# Patient Record
Sex: Male | Born: 1960
Health system: Southern US, Community
[De-identification: ages and names within clinical notes are randomized; demographics above are authoritative.]

## PROBLEM LIST (undated history)

## (undated) DIAGNOSIS — E785 Hyperlipidemia, unspecified: Secondary | ICD-10-CM

## (undated) DIAGNOSIS — I10 Essential (primary) hypertension: Secondary | ICD-10-CM

## (undated) DIAGNOSIS — K635 Polyp of colon: Secondary | ICD-10-CM

## (undated) DIAGNOSIS — I2510 Atherosclerotic heart disease of native coronary artery without angina pectoris: Secondary | ICD-10-CM

## (undated) HISTORY — DX: Essential (primary) hypertension: I10

## (undated) HISTORY — DX: Polyp of colon: K63.5

## (undated) HISTORY — DX: Hyperlipidemia, unspecified: E78.5

## (undated) HISTORY — DX: Atherosclerotic heart disease of native coronary artery without angina pectoris: I25.10

---

## 2006-05-31 ENCOUNTER — Inpatient Hospital Stay (HOSPITAL_COMMUNITY): Admission: EM | Admit: 2006-05-31 | Discharge: 2006-06-02 | Payer: Self-pay | Admitting: Pediatrics

## 2007-10-17 ENCOUNTER — Encounter: Admission: RE | Admit: 2007-10-17 | Discharge: 2007-10-17 | Payer: Self-pay | Admitting: Specialist

## 2011-01-21 NOTE — H&P (Signed)
Andrew Clark NO.:  [id number]   MEDICAL RECORD NO.:  [id number]          PATIENT TYPE:  INP   LOCATION:  2036                         FACILITY:  [facility]   PHYSICIAN:  Francisca December, M.D.  DATE OF BIRTH:  November 14, 1960   DATE OF ADMISSION:  05/31/2006  DATE OF DISCHARGE:                                HISTORY & PHYSICAL   REASON FOR ADMISSION:  Chest pain.   HISTORY OF PRESENT ILLNESS:  [name] is a pleasant 50 year old man  without prior cardiac history who, while in staff meeting this morning  developed the onset of difficulty breathing.  He was able move air in and  out, but there was a pressure in his chest.  This pressure intensified and  became more heavy and aching in nature.  It did not radiate.  It was mid-  substernal.  It was associated with some mild diaphoresis.  It persisted  over 15-20 minutes and as he got up to walk to his office, he became  somewhat lightheaded.  He finally was taken to the plant nursing office  where blood pressure was noted to be elevated.  He was driven to his primary  care physician's office, where again, his blood pressure was elevated and he  continued to have chest discomfort.  An EKG there was unremarkable.  He was  transported by EMS to [facility] emergency room and received his third  nitroglycerin en route.  Shortly after arriving here, he received an  additional nitroglycerin as well as 4 mg of morphine.  At the time of my  evaluation at 11:30 a.m.Marland Kitchen he is without significant chest discomfort.  He  does complain of being quite anxious and appears so.   He has had mild chest discomfort intermittently and spontaneous for the past  month or two.  Has awoken from sleep at night.  It is not clearly  exertional.  There is mild dyspnea associated with it.  It usually resolves  within 10 minutes or so on it's own.   PAST MEDICAL HISTORY:  1. Hypertension.  2. Hyperlipidemia.  3. High work stress.  4. Erectile  dysfunction.   PAST SURGICAL HISTORY:  Unremarkable.   ALLERGIES:  SULFA, unknown reaction.   CURRENT MEDICATIONS:  1. Simvastatin 40 mg p.o. q.d.  2. atenolol 50 mg.  3. Viagra 1-2 as needed p.r.n.   SOCIAL HISTORY:  Rare tobacco usage in the form of cigarettes.  He drinks  alcohol 3-4 times a week, usually 2-3 ounces at a time.  Has been a heavy  drinker in the past.  Has been a heavier smoker in the past.  Decreased in  the past year or two.  He is accompanied in the emergency room by his  coworker and his ex-wife.  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   FAMILY HISTORY:  His father developed congestive heart failure and had  murmurs late in his 40's and early 88s.  In his 66s, he underwent a valve  operation.  He is not certain about any  problem with coronary artery  disease.  His mother has frequent complaints of muscle and joint disorder,  but he is uncertain of any specific diagnosis.   REVIEW OF SYSTEMS:  Otherwise negative.   PHYSICAL EXAMINATION:  VITAL SIGNS:  Blood pressure 145/85, has been as high  as 185/105.  The heart rate is 67, respiratory rate 20, temperature 98.2, O2  saturation on room air 98%.  GENERAL:  This is a anxious, middle-aged Caucasian male in no distress, well-  kept.  HEENT:  Unremarkable.  Head is atraumatic, normocephalic.  Pupils are equal,  round and reactive to light and accommodation.  Extraocular movements are  intact.  Sclerae are anicteric.  Oral mucosa is pink and moist.  Teeth and  gums in good repair.  Tongue is not coated.  NECK:  Supple without thyromegaly or masses.  The, upstrokes are normal.  There is no bruit is noted ending distension.  CHEST:  Clear with adequate excursion, normal vesicular breath sounds are  heard throughout.  The precordium is quiet.  normal S1-S2 is heard.  no S3-  S4, murmur, click or rub noted.  ABDOMEN:  Flat, soft, nontender.  No hepatosplenomegaly or midline pulsatile   mass.  Bowel sounds present all quadrants.  EXTERNAL GENITALIA:  Normal male phallus, descended testicles.  No lesions.  RECTAL:  Not performed.  EXTREMITIES:  Show full range of motion.  No edema.  Intact distal pulses.  NEUROLOGICAL: Cranial nerves II-XII intact.  Motor and sensory grossly  intact.  Gait not tested.  SKIN:  Warm, dry and clear.   CLINICAL DATA:  Laboratory is pending.  Chest x-ray is pending.  EKG normal  sinus rhythm, normal EKG.   IMPRESSION:  1. Relatively acute onset of chest pain with associated dyspnea and      nausea.  Certainly entirely compatible with unstable angina pectoris.      Normal EKG does not rule this out.  2. Multiple risk factors coronary heart disease including hypertension,      hyperlipidemia and male sex in his 57s.  3. Hypertension exacerbated daily.  4. Dyslipidemia.  5. Questionable degree of ethanol abuse.  6. High work stress.   PLAN:  1. The patient is admitted for telemetry monitoring.  Serial CK-MB and      troponin enzymes to be drawn.  Repeat ECG.  2. IV nitroglycerin, beta blocker, aspirin, all been administered.  3. Morphine sulfate and p.r.n. lorazepam.  4. Proton pump inhibitor.  5. Subcutaneous Lovenox 100 mg subcu q.12 h.  6. Further diagnostic testing to be based on results of his laboratory      work and repeat ECG.  Likely will recall require coronary angiography      prior to discharge.   This was briefly discussed with the patient.  Goals, risks, alternatives  were reviewed agrees to proceed, if required.,      Francisca December, M.D.  Electronically Signed     JHE/MEDQ  D:  05/31/2006  T:  06/01/2006  Job:  [id number]   cc:   Jethro Bastos, M.D.

## 2011-01-21 NOTE — Cardiovascular Report (Signed)
NAMEASTOR, GENTLE NO.:  [id number]   MEDICAL RECORD NO.:  [id number]          PATIENT TYPE:  INP   LOCATION:  2036                         FACILITY:  [facility]   PHYSICIAN:  Francisca December, M.D.  DATE OF BIRTH:  [birth year]   DATE OF PROCEDURE:  06/01/2006  DATE OF DISCHARGE:                              CARDIAC CATHETERIZATION   REFERRING PHYSICIAN:  Jethro Bastos, M.D.   PROCEDURES PERFORMED:  1. Left heart catheterization.  2. Left ventriculogram.  3. Coronary angiography.  4. PCI drug-eluting stent implantation mid-LAD.   INDICATIONS:  [name] is a 50 year old man who was admitted  yesterday with a prolonged episode of chest pain and near syncope.  He has  ruled out for myocardial infarction.  Because of multiple risk factors and  the typical nature of his chest discomfort, he is brought to catheterization  laboratory at this time to identify the extent of disease and provide for  further therapeutic options.   PROCEDURE NOTE:  The patient was brought to the cardiac catheterization  laboratory in a fasting state.  The right groin was prepped and draped in  the usual sterile fashion.  Local anesthesia was obtained with the  infiltration of 1% lidocaine.  A 6-French catheter sheath was inserted  percutaneously into the right femoral artery, utilizing an anterior approach  over guiding J-wire.  Left heart catheterization and coronary angiography  then proceeded in a standard fashion using 6-French #4 left and right  Judkins catheters and a 110-cm pigtail catheter.  A 45 degree RAO cine left  ventriculogram was performed, utilizing a power injector, 45 mL were  injected at 13 cc/sec.  The patient did receive intracoronary nitroglycerin  during the coronary angiography which was performed in multiple LAO and RAO  projections.   I then proceeded with coronary intervention of the mid-anterior descending  artery.  The patient had received 100 mg of  Lovenox therapeutic dose within  the last 8 hours.  He was administered a double bolus and constant infusion  of Integrilin.  A 6-French number 3.5 CLS guiding catheter was advanced to  the ascending aorta, where the left coronary os was engaged and the and a  0.014 inch Luge intracoronary guidewire was passed across the lesion in the  mid LAD without difficulty.  The lesion was primarily stented using a  2.75/12-mm Scimed TAXUS intracoronary drug-eluting stent.  This was  carefully positioned and deployed at peak pressure of 12 atmospheres for  approximately 45 seconds.  A second balloon dilatation for approximately 10  seconds was required to get the balloon to release from the stent.  The  stent balloon was removed and a 2.75/8-mm Scimed Quantum Maverick  intracoronary balloon was carefully positioned within the stented segment  and inflated on several occasions to as high a pressure of 16 atmospheres.  This balloon was deflated and removed and following confirmation of adequate  patency in orthogonal views, both with and without the guidewire in place,  the guiding catheter was removed.  A right femoral arteriogram in the 45  degree  RAO angulation, utilizing the catheter sheath by hand injection  documented adequate anatomy for placement of the percutaneous closure  device, AngioSeal.  This was subsequently deployed with good hemostasis and  an intact distal pulse.   HEMODYNAMIC RESULTS:  1. Systemic arterial pressure was 122/81 with a mean of 100-mmHg.  2. There is no systolic gradient across the aortic valve.  3. The left ventricular end-diastolic pressure was 16-mmHg pre      ventriculogram.   ANGIOGRAPHY:  1. The left ventriculogram demonstrated normal chamber size and normal      global systolic function without regional wall motion abnormality.  2. There was no coronary calcification seen.  3. There was no mitral regurgitation.  4. The aortic valve is trileaflet and opens  normally during systole.  5. A visual estimate of ejection fraction is 65%.   1. There is a right dominant coronary system present.  2. The main left coronary artery was normal.  3. The left anterior descending artery and its branches are highly      diseased; the vessel had a large septal perforator which subdivides in      the mid and proximal septum.  At the origin of the first septal      perforator, there is a 10-20% eccentric narrowing.  The ongoing vessel      then gives rise to a large diagonal branch which subdivides on the      anterior lateral wall of the heart and the ongoing anterior descending      artery is relatively small.  It reaches but does not traverse the apex      and is highly diseased; there is a focal 90% stenosis approximately 50-      mm from the origin.  The diagonal branch and the segment between the      origin of the diagonal branch and the lesion is diffusely diseased.  4. The left circumflex coronary and its branches are minimally diseased;      there is a 20% narrowing in the origin of a large second marginal      branch.  The first marginal branch is quite small.  The ongoing      circumflex gives rise only to a small posterolateral branch.  5. The right coronary artery and its branches show luminal irregularities.      There is a 20% narrowing in the proximal to mid segments.  Distally,      the vessel divides into a small to moderate size posterolateral segment      which gives rise to one small to moderate size left ventricular branch.      The posterior descending artery itself is relatively large and reaches      but does not traverse the apex.  There are no obstructions in the      distal right coronary or its distal branches.   Following balloon dilatation and stent implantation in the mid anterior  descending artery, there is a 10% residual stenosis.   FINAL IMPRESSION:  1. Atherosclerotic cardiovascular disease, single vessel. 2. Status  post successful percutaneous coronary intervention and slash      drug-eluting stent implantation mid-anterior descending artery.  3. Intact left ventricular size and global systolic function.  4. Mild angina in the form of mild chest discomfort and left arm aching      was produced during balloon inflation and stent deployment.      Francisca December, M.D.  Electronically Signed  JHE/MEDQ  D:  06/01/2006  T:  06/03/2006  Job:  [id number]

## 2011-05-01 ENCOUNTER — Encounter: Payer: Self-pay | Admitting: Emergency Medicine

## 2011-05-01 ENCOUNTER — Inpatient Hospital Stay (INDEPENDENT_AMBULATORY_CARE_PROVIDER_SITE_OTHER)
Admission: RE | Admit: 2011-05-01 | Discharge: 2011-05-01 | Disposition: A | Discharge status: Home / Self Care | Payer: 59 | Source: Ambulatory Visit | Attending: Emergency Medicine | Admitting: Emergency Medicine

## 2011-05-01 DIAGNOSIS — J019 Acute sinusitis, unspecified: Secondary | ICD-10-CM

## 2011-05-01 DIAGNOSIS — I10 Essential (primary) hypertension: Secondary | ICD-10-CM | POA: Insufficient documentation

## 2011-08-08 NOTE — Progress Notes (Signed)
Summary: sinus infec?/TM   Vital Signs:  Patient Profile:   50 Years Old Male CC:      ? SINUS INFECTION Height:     73 inches Weight:      219.75 pounds O2 Sat:      97 % O2 treatment:    Room Air Temp:     98.3 degrees F oral Pulse rate:   77 / minute Resp:     18 per minute BP sitting:   133 / 87  (left arm) Cuff size:   regular  Vitals Entered By: Linton Flemings RN (May 01, 2011 12:39 PM)                  Updated Prior Medication List: * PLAVIX 50 MG TABS (CLOPIDOGREL BISULFATE)  DAILY ATENOLOL 50 MG TABS (ATENOLOL) DAILY  CRESTOR 40 MG TABS (ROSUVASTATIN CALCIUM) DAILY ASPIRIN 325 MG TABS (ASPIRIN) DAILY  Current Allergies: ! SULFAHistory of Present Illness Chief Complaint: ? SINUS INFECTION History of Present Illness: 50 Years Old Male complains of onset of cold symptoms for a few days.  Abishai has been using Motrin which is helping a little bit.   Flying to [place] in a few days. No sore throat No cough No pleuritic pain No wheezing + nasal irritation and presure (R) No post-nasal drainage + sinus pain/pressure No chest congestion No itchy/red eyes No earache No hemoptysis No SOB No chills/sweats No fever No nausea No vomiting No abdominal pain No diarrhea No skin rashes No fatigue No myalgias No headache   REVIEW OF SYSTEMS Constitutional Symptoms      Denies fever, chills, night sweats, weight loss, weight gain, and fatigue.  Eyes       Denies change in vision, eye pain, eye discharge, glasses, contact lenses, and eye surgery. Ear/Nose/Throat/Mouth       Complains of frequent runny nose and sinus problems.      Denies hearing loss/aids, change in hearing, ear pain, ear discharge, dizziness, frequent nose bleeds, sore throat, hoarseness, and tooth pain or bleeding.  Respiratory       Denies dry cough, productive cough, wheezing, shortness of breath, asthma, bronchitis, and emphysema/COPD.  Cardiovascular       Denies murmurs, chest pain, and  tires easily with exhertion.    Gastrointestinal       Denies stomach pain, nausea/vomiting, diarrhea, constipation, blood in bowel movements, and indigestion. Genitourniary       Denies painful urination, kidney stones, and loss of urinary control. Neurological       Denies paralysis, seizures, and fainting/blackouts. Musculoskeletal       Denies muscle pain, joint pain, joint stiffness, decreased range of motion, redness, swelling, muscle weakness, and gout.  Skin       Denies bruising, unusual mles/lumps or sores, and hair/skin or nail changes.  Psych       Denies mood changes, temper/anger issues, anxiety/stress, speech problems, depression, and sleep problems.  Past History:  Past Medical History: heart dz Hypertension  Past Surgical History: stents  Social History: smoke- no alcohol-no drug use- no Physical Exam General appearance: well developed, well nourished, no acute distress Head: R nasal/sphenoid tenderness Ears: normal, no lesions or deformities Nasal: mucosa pink, nonedematous, no septal deviation, turbinates normal Oral/Pharynx: tongue normal, posterior pharynx without erythema or exudate Chest/Lungs: no rales, wheezes, or rhonchi bilateral, breath sounds equal without effort Heart: regular rate and  rhythm, no murmur MSE: oriented to time, place, and person Assessment New Problems: HYPERTENSION (ICD-401.9)  ACUTE SINUSITIS, UNSPECIFIED (ICD-461.9)   Plan New Medications/Changes: AUGMENTIN 875-125 MG TABS (AMOXICILLIN-POT CLAVULANATE) 1 by mouth two times a day for 10 days  #20 x 0, 05/01/2011, Hoyt Koch MD  New Orders: New Patient Level III [phone] Planning Comments:   1)  Take the prescribed antibiotic as instructed. 2)  Use nasal saline solution (over the counter) at least 3 times a day. 3)  Use over the counter decongestants like Zyrtec-D every 12 hours as needed to help with congestion.  Especially before flying.  Caution with HTN. 4)   Can take tylenol every 6 hours or motrin every 8 hours for pain or fever. 5)  Follow up with your primary doctor  if no improvement in 5-7 days, sooner if increasing pain, fever, or new symptoms.    The patient and/or caregiver has been counseled thoroughly with regard to medications prescribed including dosage, schedule, interactions, rationale for use, and possible side effects and they verbalize understanding.  Diagnoses and expected course of recovery discussed and will return if not improved as expected or if the condition worsens. Patient and/or caregiver verbalized understanding.  Prescriptions: AUGMENTIN 875-125 MG TABS (AMOXICILLIN-POT CLAVULANATE) 1 by mouth two times a day for 10 days  #20 x 0   Entered and Authorized by:   Hoyt Koch MD   Signed by:   Hoyt Koch MD on 05/01/2011   Method used:   Print then Give to Patient   RxID:   [id number]   Orders Added: 1)  New Patient Level III [43329]

## 2013-10-30 ENCOUNTER — Other Ambulatory Visit: Payer: Self-pay | Admitting: Interventional Cardiology

## 2014-02-28 ENCOUNTER — Encounter: Payer: Self-pay | Admitting: Interventional Cardiology

## 2014-04-03 ENCOUNTER — Ambulatory Visit: Payer: 59 | Admitting: Interventional Cardiology

## 2014-05-01 ENCOUNTER — Other Ambulatory Visit: Payer: Self-pay | Admitting: Interventional Cardiology

## 2014-05-16 ENCOUNTER — Encounter: Payer: Self-pay | Admitting: Cardiology

## 2014-05-16 ENCOUNTER — Ambulatory Visit (INDEPENDENT_AMBULATORY_CARE_PROVIDER_SITE_OTHER): Payer: BC Managed Care – PPO | Admitting: Interventional Cardiology

## 2014-05-16 ENCOUNTER — Encounter: Payer: Self-pay | Admitting: Interventional Cardiology

## 2014-05-16 VITALS — BP 118/88 | HR 74 | Ht 73.0 in | Wt 234.0 lb

## 2014-05-16 DIAGNOSIS — E785 Hyperlipidemia, unspecified: Secondary | ICD-10-CM

## 2014-05-16 DIAGNOSIS — I2510 Atherosclerotic heart disease of native coronary artery without angina pectoris: Secondary | ICD-10-CM

## 2014-05-16 DIAGNOSIS — I10 Essential (primary) hypertension: Secondary | ICD-10-CM

## 2014-05-16 NOTE — Progress Notes (Signed)
Patient ID: Andrew Clark, male   DOB: [birth year], 52 y.o.   MRN: [id number]    [address] [place], [place]  [address] Phone: [phone] Fax:  [phone]  Date:  05/16/2014   ID:  Andrew Clark, DOB 1960-10-18, MRN [id number]  PCP:  Darrow Bussing, MD      History of Present Illness: Andrew Clark is a 53 y.o. male who had an LAD stent in 2007. He has been taking DAPT since that time. No problems walking up stairs. He is remodelling a house and working hard without any cardiac sx. CAD/ASCVD:  Prior to stent, he had typical angina while at work. He was SOB, left arm numbness, chest pressure, and began getting cold and clammy. He had to stop walking before getting to his office. He was hypertensive. He stopped smoking. He exercises on the treadmill. He lifts weights also.  Decreased in the last month. Exercise was limited due to a right biceps tear, but this is better. No cardiac sx prior to the biceps tear a few months ago. His weight has gone up since then. Denies : Chest pain.  Diaphoresis.  Dizziness.  Dyspnea on exertion.  Fatigue.  Leg edema.  Nitroglycerin.  Orthopnea.  Palpitations.  Paroxysmal nocturnal dyspnea.  Shortness of breath.  Syncope.     Wt Readings from Last 3 Encounters:  05/16/14 234 lb (106.142 kg)  05/01/11 219 lb 12 oz (99.678 kg)     Past Medical History  Diagnosis Date  . Coronary atherosclerosis of native coronary artery   . Essential hypertension, benign   . Other and unspecified hyperlipidemia   . ASCVD (arteriosclerotic cardiovascular disease)     (single vessel sten) Dr. Amil Amen 2007  . Hyperplastic colon polyp     repeat 10 years Dr. Bosie Clos    Current Outpatient Prescriptions  Medication Sig Dispense Refill  . aspirin 325 MG tablet Take 325 mg by mouth daily.      Marland Kitchen atenolol (TENORMIN) 50 MG tablet Take 50 mg by mouth daily.       . betamethasone dipropionate (DIPROLENE) 0.05 % cream Apply topically daily.       .  clopidogrel (PLAVIX) 75 MG tablet TAKE 1 TABLET BY MOUTH ONCE A DAY  30 tablet  0  . CRESTOR 40 MG tablet Take 40 mg by mouth daily.       Marland Kitchen loteprednol (LOTEMAX) 0.5 % ophthalmic suspension       . VIAGRA 100 MG tablet Take 50 mg by mouth as needed for erectile dysfunction.        No current facility-administered medications for this visit.    Allergies:    Allergies  Allergen Reactions  . Sulfonamide Derivatives     Social History:  The patient  reports that he has quit smoking. He does not have any smokeless tobacco history on file.   Family History:  The patient's family history includes CAD in his father; Heart disease in his father.   ROS:  Please see the history of present illness.  No nausea, vomiting.  No fevers, chills.  No focal weakness.  No dysuria.   All other systems reviewed and negative.   PHYSICAL EXAM: VS:  BP 118/88  Pulse 74  Ht  (1.854 m)  Wt 234 lb (106.142 kg)  BMI 30.88 kg/m2 Well nourished, well developed, in no acute distress HEENT: normal Neck: no JVD, no carotid bruits Cardiac:  normal S1, S2; RRR;  Lungs:  clear to  auscultation bilaterally, no wheezing, rhonchi or rales Abd: soft, nontender, no hepatomegaly Ext: no edema Skin: warm and dry Neuro:   no focal abnormalities noted  EKG:  NSR, normal  ASSESSMENT AND PLAN:  Coronary atherosclerosis of native coronary artery  Continue Aspirin Tablet Delayed Release, 325 MG, 1 tablet, Orally, Once a day IMAGING: EKG    Corson,Danielle 04/04/2013 11:44:23 AM > Margerite Impastato,JAY 04/04/2013 12:01:07 PM > Normal   Notes: No angina.   No plan for stress test at this time since he is feeling well.  Negative stress in 2008.   2. Essential hypertension, benign  Continue Atenolol Tablet, 50 Milligram, TAKE 1 TABLET BY MOUTH ONCE A DAY Notes: Controlled usually.  Continue attempts at weight loss.       3. Hyperlipidemia  Continue Crestor Tablet, 40 Milligram, TAKE 1 TABLET BY MOUTH ONCE A DAY Notes:  LDL < 70 at last check. LDL 39 in 1/15.   Preventive Medicine  Adult topics discussed:  Diet: healthy diet, low calorie, low fat.  Exercise: 5 days a week, at least 30 minutes of aerobic exercise.      Signed, Andrew Mare, MD, [facility] 05/16/2014 4:48 PM

## 2014-05-16 NOTE — Patient Instructions (Signed)
Your physician recommends that you continue on your current medications as directed. Please refer to the Current Medication list given to you today.  Your physician wants you to follow-up in: 1 YEAR WITH DR. VARANASI. You will receive a reminder letter in the mail two months in advance. If you don't receive a letter, please call our office to schedule the follow-up appointment.  

## 2014-06-03 ENCOUNTER — Other Ambulatory Visit: Payer: Self-pay | Admitting: Interventional Cardiology

## 2014-08-02 ENCOUNTER — Encounter: Payer: Self-pay | Admitting: *Deleted

## 2014-08-18 ENCOUNTER — Telehealth: Payer: Self-pay | Admitting: Interventional Cardiology

## 2014-08-18 NOTE — Telephone Encounter (Signed)
New message      Please call Dr Genelle Balevaneyat [phone] to clear pt to have a tooth extracted.  He needs to stop his plavix and aspirin prior to extraction.  Please call dentist and give clearance

## 2014-08-19 NOTE — Telephone Encounter (Signed)
Follow Up    Pt is following up regarding holding Plavix and Asprin x 5 days prior to dental work. Please call.

## 2014-08-19 NOTE — Telephone Encounter (Signed)
I spoke with the patient. He is aware that I will review Dr. Eldridge DaceVaranasi in the morning and be in touch with his dentist office.

## 2014-08-20 ENCOUNTER — Encounter: Payer: Self-pay | Admitting: *Deleted

## 2014-08-20 NOTE — Telephone Encounter (Signed)
I called and spoke with Lela at Dr. Leonie Manevaney's office. Will fax a note to [phone] stating it is ok for him to hold ASA and plavix for 5 days prior to tooth extraction. The patient is also aware.

## 2014-08-20 NOTE — Telephone Encounter (Signed)
Ok to hold aspirin and plavix for 5 days prior to dental work.

## 2015-05-26 ENCOUNTER — Other Ambulatory Visit: Payer: Self-pay | Admitting: Interventional Cardiology

## 2015-06-22 ENCOUNTER — Other Ambulatory Visit: Payer: Self-pay | Admitting: Interventional Cardiology

## 2015-06-22 MED ORDER — CLOPIDOGREL BISULFATE 75 MG PO TABS: 75.0000 mg | ORAL_TABLET | Freq: Every day | ORAL | Status: DC

## 2015-07-16 ENCOUNTER — Other Ambulatory Visit: Payer: Self-pay | Admitting: Interventional Cardiology

## 2015-09-02 ENCOUNTER — Other Ambulatory Visit: Payer: Self-pay | Admitting: Interventional Cardiology

## 2015-09-02 NOTE — Telephone Encounter (Signed)
Pt has been given several warning in his refill notes that he needs an appt, starting in sept, he is overdue by 3 months and no appt. Thanks

## 2015-09-03 NOTE — Telephone Encounter (Signed)
Spoke with pt and scheduled appt with Dr. Eldridge DaceVaranasi for 10/29/15. Sent in prescription to requested pharmacy and advised pt that he would need to keep appt to continue to get refills. Pt verbalized understanding and was in agreement with this plan.

## 2015-10-28 NOTE — Progress Notes (Signed)
Patient ID: Andrew Clark, male   DOB: 07-21-1961, 55 y.o.   MRN: [id number]     Cardiology Office Note   Date:  10/29/2015   ID:  [name], DOB 1960/10/21, MRN [id number]  PCP:  Darrow Bussing, MD    No chief complaint on file. CAD   Wt Readings from Last 3 Encounters:  10/29/15 229 lb (103.874 kg)  05/16/14 234 lb (106.142 kg)  05/01/11 219 lb 12 oz (99.678 kg)       History of Present Illness: Andrew Clark is a 55 y.o. male  who had an LAD stent in 2007. He has been taking DAPT since that time. No problems walking up stairs. He is running or exercise several times a week.   CAD/ASCVD:  Prior to stent, he had typical angina while at work. He was SOB, left arm numbness, chest pressure, and began getting cold and clammy. He had to stop walking before getting to his office.   He has trouble sleeping.  He had been using Advil PM.  Exercise helps him sleep. He stopped smoking years ago.  He exercises on the treadmill. He lifts light weights also. He feels well when he exercises. No symptoms like what he had before his stent.  Denies : Chest pain.  Diaphoresis.  Dizziness.  Dyspnea on exertion.  Fatigue.  Leg edema.  Nitroglycerin.  Orthopnea.  Palpitations.  Paroxysmal nocturnal dyspnea.  Shortness of breath.  Syncope.     Past Medical History  Diagnosis Date  . Coronary atherosclerosis of native coronary artery   . Essential hypertension, benign   . Other and unspecified hyperlipidemia   . ASCVD (arteriosclerotic cardiovascular disease)     (single vessel sten) Dr. Amil Amen 2007  . Hyperplastic colon polyp     repeat 10 years Dr. Bosie Clos    History reviewed. No pertinent past surgical history.   Current Outpatient Prescriptions  Medication Sig Dispense Refill  . aspirin 325 MG tablet Take 325 mg by mouth daily.    Marland Kitchen atenolol (TENORMIN) 50 MG tablet Take 50 mg by mouth daily.     . clopidogrel (PLAVIX) 75 MG tablet Take 1 tablet (75 mg total) by mouth  once. 90 tablet 0  . CRESTOR 40 MG tablet Take 40 mg by mouth daily.     Marland Kitchen VIAGRA 100 MG tablet Take 50 mg by mouth as needed for erectile dysfunction.      No current facility-administered medications for this visit.    Allergies:   Sulfonamide derivatives    Social History:  The patient  reports that he has quit smoking. He does not have any smokeless tobacco history on file.   Family History:  The patient's family history includes CAD in his father; Heart disease in his father; Hypertension in his father. There is no history of Heart attack or Stroke.    ROS:  Please see the history of present illness.   Otherwise, review of systems are positive for easy bruising.   All other systems are reviewed and negative.    PHYSICAL EXAM: VS:  BP 129/90 mmHg  Pulse 72  Ht  (1.854 m)  Wt 229 lb (103.874 kg)  BMI 30.22 kg/m2 , BMI Body mass index is 30.22 kg/(m^2). GEN: Well nourished, well developed, in no acute distress HEENT: left eyelid droops Neck: no JVD, carotid bruits, or masses Cardiac: RRR; no murmurs, rubs, or gallops,no edema  Respiratory:  clear to auscultation bilaterally, normal work of breathing GI: soft, nontender, nondistended, +  BS MS: no deformity or atrophy Skin: warm and dry, no rash Neuro:  Strength and sensation are intact Psych: euthymic mood, full affect   EKG:   The ekg ordered today demonstrates normal   Recent Labs: No results found for requested labs within last 365 days.   Lipid Panel No results found for: CHOL, TRIG, HDL, CHOLHDL, VLDL, LDLCALC, LDLDIRECT   Other studies Reviewed: Additional studies/ records that were reviewed today with results demonstrating: 2008 normal stress test.  2007 cath report reviewed. LAD stent placed, taxus. Mild disease in the circumflex and right.   ASSESSMENT AND PLAN:  1. CAD : No angina. Plan for stress test at this time since his stent is 54 years old. Negative stress in 2008.  Stop aspirin to reduce  risk of bleeding. 2. HTN: Controlled. Continue current medicines. 3. Hyperlipidemia: Followed by PMD. Continue current lipid-lowering therapy. 4. Bruising: Should improve with stopping aspirin. Continue clopidogrel for secondary prevention. 5. Insomnia: OK to use melatonin.    Current medicines are reviewed at length with the patient today.  The patient concerns regarding his medicines were addressed.  The following changes have been made:  Stop aspirin  Labs/ tests ordered today include:  Treadmill test  No orders of the defined types were placed in this encounter.    Recommend 150 minutes/week of aerobic exercise Low fat, low carb, high fiber diet recommended  Disposition:   FU in 1 year   Delorise Jackson., MD  10/29/2015 9:11 AM    [facility] [address] [place], [place], [place]  [address] Phone: [phone]; Fax: [phone]

## 2015-10-29 ENCOUNTER — Encounter: Payer: Self-pay | Admitting: Interventional Cardiology

## 2015-10-29 ENCOUNTER — Ambulatory Visit (INDEPENDENT_AMBULATORY_CARE_PROVIDER_SITE_OTHER): Payer: BLUE CROSS/BLUE SHIELD | Admitting: Interventional Cardiology

## 2015-10-29 VITALS — BP 129/90 | HR 72 | Ht 73.0 in | Wt 229.0 lb

## 2015-10-29 DIAGNOSIS — T148XXA Other injury of unspecified body region, initial encounter: Secondary | ICD-10-CM | POA: Insufficient documentation

## 2015-10-29 DIAGNOSIS — T148 Other injury of unspecified body region: Secondary | ICD-10-CM

## 2015-10-29 DIAGNOSIS — I2510 Atherosclerotic heart disease of native coronary artery without angina pectoris: Secondary | ICD-10-CM

## 2015-10-29 DIAGNOSIS — E785 Hyperlipidemia, unspecified: Secondary | ICD-10-CM

## 2015-10-29 DIAGNOSIS — I10 Essential (primary) hypertension: Secondary | ICD-10-CM | POA: Diagnosis not present

## 2015-10-29 DIAGNOSIS — G4700 Insomnia, unspecified: Secondary | ICD-10-CM | POA: Diagnosis not present

## 2015-10-29 NOTE — Addendum Note (Signed)
Addended by: Demetrios Loll on: 10/29/2015 10:34 AM   Modules accepted: Orders, Medications

## 2015-10-29 NOTE — Patient Instructions (Addendum)
**Note De-Identified Deni Lefever Obfuscation** Medication Instructions:  Stop taking Aspirin-all other medications remain the same.  Labwork: None  Testing/Procedures: Your physician has requested that you have an exercise tolerance test. For further information please visit [email or web address] Please also follow instruction sheet, as given.  Follow-Up: Your physician wants you to follow-up in: 1 year. You will receive a reminder letter in the mail two months in advance. If you don't receive a letter, please call our office to schedule the follow-up appointment.     If you need a refill on your cardiac medications before your next appointment, please call your pharmacy.

## 2015-11-06 ENCOUNTER — Ambulatory Visit (INDEPENDENT_AMBULATORY_CARE_PROVIDER_SITE_OTHER): Payer: BLUE CROSS/BLUE SHIELD

## 2015-11-06 DIAGNOSIS — I2510 Atherosclerotic heart disease of native coronary artery without angina pectoris: Secondary | ICD-10-CM | POA: Diagnosis not present

## 2015-11-06 LAB — EXERCISE TOLERANCE TEST
CHL CUP STRESS STAGE 1 DBP: 89 mmHg
CHL CUP STRESS STAGE 1 GRADE: 0 %
CHL CUP STRESS STAGE 10 DBP: 90 mmHg
CHL CUP STRESS STAGE 10 HR: 97 {beats}/min
CHL CUP STRESS STAGE 10 SBP: 151 mmHg
CHL CUP STRESS STAGE 2 SPEED: 1 mph
CHL CUP STRESS STAGE 3 GRADE: 0 %
CHL CUP STRESS STAGE 3 HR: 97 {beats}/min
CHL CUP STRESS STAGE 3 SPEED: 1 mph
CHL CUP STRESS STAGE 4 DBP: 87 mmHg
CHL CUP STRESS STAGE 4 SBP: 143 mmHg
CHL CUP STRESS STAGE 4 SPEED: 1.7 mph
CHL CUP STRESS STAGE 5 HR: 120 {beats}/min
CHL CUP STRESS STAGE 6 DBP: 71 mmHg
CHL CUP STRESS STAGE 6 SPEED: 3.4 mph
CHL CUP STRESS STAGE 7 DBP: 79 mmHg
CHL CUP STRESS STAGE 7 SPEED: 4.2 mph
CHL CUP STRESS STAGE 8 HR: 164 {beats}/min
CHL CUP STRESS STAGE 9 GRADE: 0 %
CHL CUP STRESS STAGE 9 SBP: 213 mmHg
CHL CUP STRESS STAGE 9 SPEED: 1.5 mph
CHL RATE OF PERCEIVED EXERTION: 15
CSEPED: 13 min
CSEPEDS: 0 s
CSEPEW: 15.1 METS
CSEPHR: 98 %
CSEPPHR: 164 {beats}/min
MPHR: 167 {beats}/min
Percent of predicted max HR: 98 %
Rest HR: 78 {beats}/min
Stage 1 HR: 81 {beats}/min
Stage 1 SBP: 145 mmHg
Stage 1 Speed: 0 mph
Stage 10 Grade: 0 %
Stage 10 Speed: 0 mph
Stage 2 Grade: 0 %
Stage 2 HR: 96 {beats}/min
Stage 4 Grade: 10 %
Stage 4 HR: 108 {beats}/min
Stage 5 DBP: 75 mmHg
Stage 5 Grade: 12 %
Stage 5 SBP: 146 mmHg
Stage 5 Speed: 2.5 mph
Stage 6 Grade: 14 %
Stage 6 HR: 136 {beats}/min
Stage 6 SBP: 154 mmHg
Stage 7 Grade: 16 %
Stage 7 HR: 151 {beats}/min
Stage 7 SBP: 191 mmHg
Stage 8 Grade: 18 %
Stage 8 Speed: 4.9 mph
Stage 9 DBP: 78 mmHg
Stage 9 HR: 150 {beats}/min

## 2015-11-29 ENCOUNTER — Other Ambulatory Visit: Payer: Self-pay | Admitting: Interventional Cardiology

## 2016-04-01 DIAGNOSIS — H02413 Mechanical ptosis of bilateral eyelids: Secondary | ICD-10-CM | POA: Diagnosis not present

## 2016-04-01 DIAGNOSIS — H02831 Dermatochalasis of right upper eyelid: Secondary | ICD-10-CM | POA: Diagnosis not present

## 2016-04-01 DIAGNOSIS — H0279 Other degenerative disorders of eyelid and periocular area: Secondary | ICD-10-CM | POA: Diagnosis not present

## 2016-04-01 DIAGNOSIS — H02834 Dermatochalasis of left upper eyelid: Secondary | ICD-10-CM | POA: Diagnosis not present

## 2016-05-10 ENCOUNTER — Telehealth: Payer: Self-pay

## 2016-05-10 NOTE — Telephone Encounter (Signed)
No further cardiac testing needed before procedure.  Hold plavix 5 days prior to procedure.

## 2016-05-10 NOTE — Telephone Encounter (Signed)
Antiplatelet management and cardiac clearance both need to be completed by MD rather than PharmD, thanks.

## 2016-05-10 NOTE — Telephone Encounter (Signed)
The pt needs to have bilateral upper eyelid blepharoplasty, ptosis repair and brow ptosis repair with Dr Shawna Orleansenzo Zaldivar. They are requesting: 1. Cardiac clearance. 2. When should the pt stop taking Plavix prior to procedure and when should he resume?   Please advise.

## 2016-05-11 NOTE — Telephone Encounter (Signed)
This message has been manually faxed to Dr Shawna Orleansenzo Zaldivar at [phone]. I did receive confirmation that the fax was successful.

## 2016-06-10 DIAGNOSIS — H02834 Dermatochalasis of left upper eyelid: Secondary | ICD-10-CM | POA: Diagnosis not present

## 2016-06-10 DIAGNOSIS — H02831 Dermatochalasis of right upper eyelid: Secondary | ICD-10-CM | POA: Diagnosis not present

## 2016-06-10 DIAGNOSIS — H02413 Mechanical ptosis of bilateral eyelids: Secondary | ICD-10-CM | POA: Diagnosis not present

## 2016-06-10 DIAGNOSIS — H0279 Other degenerative disorders of eyelid and periocular area: Secondary | ICD-10-CM | POA: Diagnosis not present

## 2016-07-27 DIAGNOSIS — J029 Acute pharyngitis, unspecified: Secondary | ICD-10-CM | POA: Diagnosis not present

## 2016-07-27 DIAGNOSIS — J069 Acute upper respiratory infection, unspecified: Secondary | ICD-10-CM | POA: Diagnosis not present

## 2016-08-18 DIAGNOSIS — J069 Acute upper respiratory infection, unspecified: Secondary | ICD-10-CM | POA: Diagnosis not present

## 2016-10-04 DIAGNOSIS — H02412 Mechanical ptosis of left eyelid: Secondary | ICD-10-CM | POA: Diagnosis not present

## 2016-11-09 ENCOUNTER — Telehealth: Payer: Self-pay | Admitting: Interventional Cardiology

## 2016-11-09 NOTE — Telephone Encounter (Signed)
Patient is calling because he is scheduled for an procedure on Friday 11-18-16. Dr. Gean Maidensassandra White would like clarence from Dr.Varanasi for patient to suspend Plavix  for 5 days prior to operation on 11-18-16. Please call Dr. Cliffton AstersWhite at [phone]. Thanks.

## 2016-11-10 NOTE — Telephone Encounter (Signed)
Request for surgical clearance:  1. What type of surgery is being performed? Left upper lid ptosis repair   2. When is this surgery scheduled? 11/18/16   3. Are there any medications that need to be held prior to surgery and how long? Patient needs to hold Plavix for 5 days.    4. Name of physician performing surgery? Dr. Gean Maidensassandra White   5. What is your office phone and fax number? Phone- [phone], Fax-[phone]

## 2016-11-11 NOTE — Telephone Encounter (Signed)
Follow up    Andrew Clark is calling to follow up on surgical clearance.

## 2016-11-11 NOTE — Telephone Encounter (Signed)
F/u Call

## 2016-11-11 NOTE — Telephone Encounter (Signed)
OK to hold Plavix 5 days prior to surgery.  No further cardiac testing needed. 

## 2016-11-11 NOTE — Telephone Encounter (Signed)
**Note De-Identified Andrew Clark Obfuscation** I have faxed this message to Duwayne HeckDanielle at [phone]. I did receive confirmation that the fax was successful. I also called and verified that Danielle received my fax and she stated that she did.

## 2016-11-11 NOTE — Telephone Encounter (Addendum)
**Note De-Identified Tanise Russman Obfuscation** Per Duwayne Heckanielle they need clearance today as the pt needs to stop his Plaxix 5 days prior to procedure. Will forward to Dr Eldridge DaceVaranasi as high priority.

## 2016-11-18 DIAGNOSIS — H02412 Mechanical ptosis of left eyelid: Secondary | ICD-10-CM | POA: Diagnosis not present

## 2016-11-25 ENCOUNTER — Other Ambulatory Visit: Payer: Self-pay | Admitting: Interventional Cardiology

## 2016-12-05 ENCOUNTER — Encounter: Payer: Self-pay | Admitting: Interventional Cardiology

## 2016-12-20 ENCOUNTER — Encounter: Payer: Self-pay | Admitting: Interventional Cardiology

## 2016-12-20 ENCOUNTER — Ambulatory Visit (INDEPENDENT_AMBULATORY_CARE_PROVIDER_SITE_OTHER): Payer: BLUE CROSS/BLUE SHIELD | Admitting: Interventional Cardiology

## 2016-12-20 ENCOUNTER — Encounter (INDEPENDENT_AMBULATORY_CARE_PROVIDER_SITE_OTHER): Payer: Self-pay

## 2016-12-20 VITALS — BP 126/68 | HR 76 | Ht 73.0 in | Wt 235.0 lb

## 2016-12-20 DIAGNOSIS — T148XXA Other injury of unspecified body region, initial encounter: Secondary | ICD-10-CM | POA: Diagnosis not present

## 2016-12-20 DIAGNOSIS — I2510 Atherosclerotic heart disease of native coronary artery without angina pectoris: Secondary | ICD-10-CM

## 2016-12-20 DIAGNOSIS — G4700 Insomnia, unspecified: Secondary | ICD-10-CM | POA: Diagnosis not present

## 2016-12-20 DIAGNOSIS — E782 Mixed hyperlipidemia: Secondary | ICD-10-CM

## 2016-12-20 DIAGNOSIS — I10 Essential (primary) hypertension: Secondary | ICD-10-CM

## 2016-12-20 NOTE — Patient Instructions (Signed)

## 2016-12-20 NOTE — Progress Notes (Signed)
Patient ID: Andrew Clark, male   DOB: 11/13/1960, 56 y.o.   MRN: [id number]     Cardiology Office Note   Date:  12/20/2016   ID:  Andrew Clark, DOB [birth year], MRN [id number]  PCP:  Darrow Bussing, MD    No chief complaint on file. CAD   Wt Readings from Last 3 Encounters:  12/20/16 235 lb (106.6 kg)  10/29/15 229 lb (103.9 kg)  05/16/14 234 lb (106.1 kg)       History of Present Illness: Andrew Clark is a 56 y.o. male  who had an LAD stent in 2007. He has been taking DAPT since that time. No problems walking up stairs. He is running or exercise several times a week.   CAD/ASCVD:  Prior to stent, he had typical angina while at work. He was SOB, left arm numbness, chest pressure, and began getting cold and clammy. He had to stop walking before getting to his office.    Exercise helps him sleep. He stopped smoking years ago.  He exercises on the treadmill. He lifts light weights also. He feels well when he exercises. No symptoms like what he had before his stent.  He has difficulty losing weight.  Denies : Chest pain. Diaphoresis. Dizziness. Dyspnea on exertion. Fatigue. Leg edema.  Nitroglycerin. Orthopnea. Palpitations. Paroxysmal nocturnal dyspnea.  Shortness of breath.  Syncope.   BP at work is in the 135 range systolic.  He had successful eyelid surgery.  He came off plavix without issues.  Past Medical History:  Diagnosis Date  . ASCVD (arteriosclerotic cardiovascular disease)    (single vessel sten) Dr. Amil Amen 2007  . Coronary atherosclerosis of native coronary artery   . Essential hypertension, benign   . Hyperplastic colon polyp    repeat 10 years Dr. Bosie Clos  . Other and unspecified hyperlipidemia     No past surgical history on file.   Current Outpatient Prescriptions  Medication Sig Dispense Refill  . atenolol (TENORMIN) 50 MG tablet Take 50 mg by mouth daily.     . clopidogrel (PLAVIX) 75 MG tablet TAKE 1 TABLET BY MOUTH EVERY DAY 90 tablet 0  .  CRESTOR 40 MG tablet Take 40 mg by mouth daily.     Marland Kitchen VIAGRA 100 MG tablet Take 50 mg by mouth as needed for erectile dysfunction.      No current facility-administered medications for this visit.     Allergies:   Sulfonamide derivatives    Social History:  The patient  reports that he has quit smoking. He has never used smokeless tobacco. He reports that he does not use drugs.   Family History:  The patient's family history includes CAD in his father; Heart disease in his father; Hypertension in his father.    ROS:  Please see the history of present illness.   Otherwise, review of systems are positive for easy bruising.   All other systems are reviewed and negative.    PHYSICAL EXAM: VS:  BP 126/68   Pulse 76   Ht  (1.854 m)   Wt 235 lb (106.6 kg)   BMI 31.00 kg/m  , BMI Body mass index is 31 kg/m. GEN: Well nourished, well developed, in no acute distress  HEENT: left eyelid droops Neck: no JVD, carotid bruits, or masses Cardiac: RRR; no murmurs, rubs, or gallops,no edema  Respiratory:  clear to auscultation bilaterally, normal work of breathing GI: soft, nontender, nondistended, + BS MS: no deformity or atrophy  Skin: warm and dry, no  rash Neuro:  Strength and sensation are intact Psych: euthymic mood, full affect   EKG:   The ekg ordered today demonstrates normal sinus rhythm, no ST segment changes   Recent Labs: No results found for requested labs within last 8760 hours.   Lipid Panel No results found for: CHOL, TRIG, HDL, CHOLHDL, VLDL, LDLCALC, LDLDIRECT   Other studies Reviewed: Additional studies/ records that were reviewed today with results demonstrating: 2008 normal stress test.  2007 cath report reviewed. LAD stent placed, taxus. Mild disease in the circumflex and right.  2017 stress test normal.    ASSESSMENT AND PLAN:  1. CAD : No angina. Plan for stress test at this time since his stent is 55 years old. Negative stress in 2017.  Stopped  aspirin to reduce risk of bleeding.  COntinue clopidogrel. 2. HTN: Controlled. Continue current medicines.   3. Hyperlipidemia: Followed by PMD. Continue current lipid-lowering therapy. 4. Bruising: Better off of aspirin- but still present.  Continue clopidogrel for secondary prevention. 5. Insomnia: Uses melatonin.  Occasional use of Advil PM. Should avoid advil on a regular basis.   Current medicines are reviewed at length with the patient today.  The patient concerns regarding his medicines were addressed.  The following changes have been made:    Labs/ tests ordered today include:  Treadmill test  No orders of the defined types were placed in this encounter.   Recommend 150 minutes/week of aerobic exercise Low fat, low carb, high fiber diet recommended  Disposition:   FU in 1 year   Signed, Lance Muss, MD  12/20/2016 4:20 PM    [facility] [address] [place], [place], [place]  [address] Phone: [phone]; Fax: [phone]

## 2017-02-03 DIAGNOSIS — L03115 Cellulitis of right lower limb: Secondary | ICD-10-CM | POA: Diagnosis not present

## 2017-02-28 ENCOUNTER — Other Ambulatory Visit: Payer: Self-pay | Admitting: Interventional Cardiology

## 2017-04-03 DIAGNOSIS — D1800 Hemangioma unspecified site: Secondary | ICD-10-CM | POA: Diagnosis not present

## 2017-04-03 DIAGNOSIS — D225 Melanocytic nevi of trunk: Secondary | ICD-10-CM | POA: Diagnosis not present

## 2017-04-03 DIAGNOSIS — L821 Other seborrheic keratosis: Secondary | ICD-10-CM | POA: Diagnosis not present

## 2017-04-03 DIAGNOSIS — L814 Other melanin hyperpigmentation: Secondary | ICD-10-CM | POA: Diagnosis not present

## 2017-05-01 DIAGNOSIS — Z23 Encounter for immunization: Secondary | ICD-10-CM | POA: Diagnosis not present

## 2017-05-01 DIAGNOSIS — I2510 Atherosclerotic heart disease of native coronary artery without angina pectoris: Secondary | ICD-10-CM | POA: Diagnosis not present

## 2017-05-01 DIAGNOSIS — Z0000 Encounter for general adult medical examination without abnormal findings: Secondary | ICD-10-CM | POA: Diagnosis not present

## 2017-05-01 DIAGNOSIS — R7301 Impaired fasting glucose: Secondary | ICD-10-CM | POA: Diagnosis not present

## 2017-05-01 DIAGNOSIS — I10 Essential (primary) hypertension: Secondary | ICD-10-CM | POA: Diagnosis not present

## 2017-08-09 ENCOUNTER — Telehealth: Payer: Self-pay | Admitting: Interventional Cardiology

## 2017-08-09 NOTE — Telephone Encounter (Signed)
° °  [facility] Pre-operative Risk Assessment    Request for surgical clearance:  1. What type of surgery is being performed? Indirect sinus lift with immediate placement #3 area  2. When is this surgery scheduled? TBD  3. Are there any medications that need to be held prior to surgery and how long? Plavix, 2-3 days prior to surgery  4. Practice name and name of physician performing surgery? [facility], Dr. Helene Kelp Biggerstaff  5. What is your office phone and fax number? PH# [phone], FAX# [phone]  5. Anesthesia type (None, local, MAC, general) ? Intravenous sedation and local anesthesia with epinephrine   Derl Barrow 08/09/2017, 2:38 PM  _________________________________________________________________   (provider comments below)

## 2017-08-12 NOTE — Telephone Encounter (Signed)
   Primary Cardiologist: Lance MussJayadeep Varanasi, MD  Chart reviewed as part of pre-operative protocol coverage. Patient was contacted 08/12/2017 in reference to pre-operative risk assessment for pending surgery as outlined below.  Andrew Clark was last seen on 12/20/16 by Dr. Eldridge DaceVaranasi.  Since that day, Andrew Clark has done very well. RCRI calculated at low risk of cardiac complications (0.9%). Remains active with job, goes to the gym several times a week, able to play sports without angina.  Therefore, based on ACC/AHA guidelines, the patient would be at acceptable risk for the planned procedure without further cardiovascular testing.   Will forward to Dr. Eldridge DaceVaranasi for final input on holding of Plavix prior to procedure (pt on Plavix monotherapy). Surgery is requesting to hold only 2-3 days prior, but will ask Dr. Seth BakeV to provide input if he thinks it may be OK to hold longer if surgery deems necessary (I.e. 5 days). Dr. Eldridge DaceVaranasi please route reply to P CV DIV PREOP. Thanks.  Andrew Montanaayna N Saivion Goettel, PA-C 08/12/2017, 2:40 PM

## 2017-08-16 NOTE — Telephone Encounter (Signed)
   Chart reviewed as part of pre-operative protocol coverage. To summarize recommendations, the patient would be at acceptable risk for the planned procedure without further cardiovascular testing. Per Dr. Eldridge DaceVaranasi, OK to hold Plavix 5 days prior to surgery.  Will route this bundled recommendation to requesting provider via Epic fax function. Please call with questions.  Laurann Montanaayna N Jushua Waltman, PA-C 08/16/2017, 11:01 AM

## 2017-08-16 NOTE — Telephone Encounter (Signed)
OK to hold Plavix 5 days prior to surgery. 

## 2017-08-21 ENCOUNTER — Telehealth: Payer: Self-pay | Admitting: Interventional Cardiology

## 2017-08-21 NOTE — Telephone Encounter (Signed)
Called re: pts cardiac clearance.  They never received it. I have refaxed the letter to [phone].  They thanked me for the call.

## 2017-08-21 NOTE — Telephone Encounter (Signed)
Returned call to Dr. Aletta EdouardBiggerstaff's office.  I have refaxed the clearance for pt.

## 2017-08-21 NOTE — Telephone Encounter (Signed)
New message     Pre op clearance letter not received by  Dr. Inda Cokeeresa Biggerstaff (not on Epic) Please fax   office phone and fax number? PH# [phone], FAX# [phone]

## 2017-09-04 DIAGNOSIS — J069 Acute upper respiratory infection, unspecified: Secondary | ICD-10-CM | POA: Diagnosis not present

## 2017-09-04 DIAGNOSIS — J3489 Other specified disorders of nose and nasal sinuses: Secondary | ICD-10-CM | POA: Diagnosis not present

## 2017-09-04 DIAGNOSIS — R062 Wheezing: Secondary | ICD-10-CM | POA: Diagnosis not present

## 2017-09-04 DIAGNOSIS — R05 Cough: Secondary | ICD-10-CM | POA: Diagnosis not present

## 2017-10-16 DIAGNOSIS — I10 Essential (primary) hypertension: Secondary | ICD-10-CM | POA: Diagnosis not present

## 2017-10-16 DIAGNOSIS — R062 Wheezing: Secondary | ICD-10-CM | POA: Diagnosis not present

## 2017-10-30 DIAGNOSIS — Z125 Encounter for screening for malignant neoplasm of prostate: Secondary | ICD-10-CM | POA: Diagnosis not present

## 2017-11-29 DIAGNOSIS — R972 Elevated prostate specific antigen [PSA]: Secondary | ICD-10-CM | POA: Diagnosis not present

## 2017-11-29 DIAGNOSIS — N400 Enlarged prostate without lower urinary tract symptoms: Secondary | ICD-10-CM | POA: Diagnosis not present

## 2017-11-30 ENCOUNTER — Other Ambulatory Visit: Payer: Self-pay | Admitting: Interventional Cardiology

## 2017-12-29 DIAGNOSIS — J029 Acute pharyngitis, unspecified: Secondary | ICD-10-CM | POA: Diagnosis not present

## 2017-12-30 ENCOUNTER — Other Ambulatory Visit: Payer: Self-pay | Admitting: Interventional Cardiology

## 2018-02-13 ENCOUNTER — Ambulatory Visit: Payer: BLUE CROSS/BLUE SHIELD | Admitting: Interventional Cardiology

## 2018-02-27 ENCOUNTER — Other Ambulatory Visit: Payer: Self-pay | Admitting: Interventional Cardiology

## 2018-04-04 DIAGNOSIS — L570 Actinic keratosis: Secondary | ICD-10-CM | POA: Diagnosis not present

## 2018-04-04 DIAGNOSIS — D1800 Hemangioma unspecified site: Secondary | ICD-10-CM | POA: Diagnosis not present

## 2018-04-04 DIAGNOSIS — L814 Other melanin hyperpigmentation: Secondary | ICD-10-CM | POA: Diagnosis not present

## 2018-04-04 DIAGNOSIS — L821 Other seborrheic keratosis: Secondary | ICD-10-CM | POA: Diagnosis not present

## 2018-04-24 ENCOUNTER — Encounter: Payer: Self-pay | Admitting: Interventional Cardiology

## 2018-04-26 ENCOUNTER — Ambulatory Visit
Admission: RE | Admit: 2018-04-26 | Discharge: 2018-04-26 | Disposition: A | Discharge status: Home / Self Care | Payer: BLUE CROSS/BLUE SHIELD | Source: Ambulatory Visit | Attending: Family Medicine | Admitting: Family Medicine

## 2018-04-26 ENCOUNTER — Other Ambulatory Visit: Payer: Self-pay | Admitting: Family Medicine

## 2018-04-26 DIAGNOSIS — M25431 Effusion, right wrist: Secondary | ICD-10-CM | POA: Diagnosis not present

## 2018-04-26 DIAGNOSIS — M25531 Pain in right wrist: Secondary | ICD-10-CM | POA: Diagnosis not present

## 2018-04-26 DIAGNOSIS — R609 Edema, unspecified: Secondary | ICD-10-CM

## 2018-05-23 NOTE — Progress Notes (Signed)
Cardiology Office Note   Date:  05/24/2018   ID:  Andrew Shareodd Guiles, DOB [birth year], MRN [id number]  PCP:  Darrow BussingKoirala, Dibas, MD    No chief complaint on file.  CAD  Wt Readings from Last 3 Encounters:  05/24/18 226 lb 3.2 oz (102.6 kg)  12/20/16 235 lb (106.6 kg)  10/29/15 229 lb (103.9 kg)       History of Present Illness: Andrew Clark is a 57 y.o. male  who had an LAD stent in 2007. He has been taking DAPT since that time.  Prior to stent, he had typical angina while at work. He was SOB, left arm numbness, chest pressure, and began getting cold and clammy. He had to stop walking before getting to his office.    Exercise helps him sleep. He stopped smoking years ago.  Since the last visit, he lost weight intentionally.  Denies : Chest pain. Dizziness. Leg edema. Nitroglycerin use. Orthopnea. Palpitations. Paroxysmal nocturnal dyspnea. Shortness of breath. Syncope.   No bleeding problems.       Past Medical History:  Diagnosis Date  . ASCVD (arteriosclerotic cardiovascular disease)    (single vessel sten) Dr. Amil AmenEdmunds 2007  . Coronary atherosclerosis of native coronary artery   . Essential hypertension, benign   . Hyperplastic colon polyp    repeat 10 years Dr. Bosie ClosSchooler  . Other and unspecified hyperlipidemia     History reviewed. No pertinent surgical history.   Current Outpatient Medications  Medication Sig Dispense Refill  . atenolol (TENORMIN) 50 MG tablet Take 50 mg by mouth daily.     . clopidogrel (PLAVIX) 75 MG tablet TAKE 1 TABLET BY MOUTH EVERY DAY. Please keep upcoming appt before anymore refills. Thank you 30 tablet 2  . CRESTOR 40 MG tablet Take 40 mg by mouth daily.     Marland Kitchen. VIAGRA 100 MG tablet Take 50 mg by mouth as needed for erectile dysfunction.      No current facility-administered medications for this visit.     Allergies:   Sulfamethoxazole and Sulfonamide derivatives    Social History:  The patient  reports that he has quit smoking. He  has never used smokeless tobacco. He reports that he does not use drugs.   Family History:  The patient's family history includes CAD in his father; Heart disease in his father; Hypertension in his father.    ROS:  Please see the history of present illness.   Otherwise, review of systems are positive for intentional weight loss.   All other systems are reviewed and negative.    PHYSICAL EXAM: VS:  BP 122/88   Pulse 73   Ht 6\' 1"  (1.854 m)   Wt 226 lb 3.2 oz (102.6 kg)   SpO2 95%   BMI 29.84 kg/m  , BMI Body mass index is 29.84 kg/m. GEN: Well nourished, well developed, in no acute distress  HEENT: normal  Neck: no JVD, carotid bruits, or masses Cardiac: RRR; no murmurs, rubs, or gallops,no edema  Respiratory:  clear to auscultation bilaterally, normal work of breathing GI: soft, nontender, nondistended, + BS MS: no deformity or atrophy  Skin: warm and dry, no rash Neuro:  Strength and sensation are intact Psych: euthymic mood, full affect   EKG:   The ekg ordered today demonstrates SR, no ST changes   Recent Labs: No results found for requested labs within last 8760 hours.   Lipid Panel No results found for: CHOL, TRIG, HDL, CHOLHDL, VLDL, LDLCALC, LDLDIRECT  Other studies Reviewed: Additional studies/ records that were reviewed today with results demonstrating: labs reviewed.   ASSESSMENT AND PLAN:  1. CAD: No angina.  Continue aggressive secondary prevention.  He has improved his lifestyle and lost weight.  He continues to exercise. Spoke about dietary changes including decreasing sugar intake.  2. HTN: The current medical regimen is effective;  continue present plan and medications. 3. Hyperlipidemia: LDL 64 in 8/18.  TO be rechecked with PMD.  4. Bruising: Resolved.  Off of aspirin.   Current medicines are reviewed at length with the patient today.  The patient concerns regarding his medicines were addressed.  The following changes have been made:  No  change  Labs/ tests ordered today include:  No orders of the defined types were placed in this encounter.   Recommend 150 minutes/week of aerobic exercise Low fat, low carb, high fiber diet recommended  Disposition:   FU in 1 year   Signed, Lance Muss, MD  05/24/2018 12:06 PM    [facility] [address] [place], [place], [place]  [address] Phone: [phone]; Fax: [phone]

## 2018-05-24 ENCOUNTER — Ambulatory Visit (INDEPENDENT_AMBULATORY_CARE_PROVIDER_SITE_OTHER): Payer: BLUE CROSS/BLUE SHIELD | Admitting: Interventional Cardiology

## 2018-05-24 ENCOUNTER — Encounter: Payer: Self-pay | Admitting: Interventional Cardiology

## 2018-05-24 VITALS — BP 122/88 | HR 73 | Ht 73.0 in | Wt 226.2 lb

## 2018-05-24 DIAGNOSIS — T148XXA Other injury of unspecified body region, initial encounter: Secondary | ICD-10-CM

## 2018-05-24 DIAGNOSIS — I10 Essential (primary) hypertension: Secondary | ICD-10-CM

## 2018-05-24 DIAGNOSIS — I2510 Atherosclerotic heart disease of native coronary artery without angina pectoris: Secondary | ICD-10-CM | POA: Diagnosis not present

## 2018-05-24 DIAGNOSIS — E782 Mixed hyperlipidemia: Secondary | ICD-10-CM

## 2018-05-24 NOTE — Patient Instructions (Signed)

## 2018-05-25 ENCOUNTER — Ambulatory Visit: Payer: BLUE CROSS/BLUE SHIELD | Admitting: Interventional Cardiology

## 2018-05-30 ENCOUNTER — Other Ambulatory Visit: Payer: Self-pay | Admitting: Interventional Cardiology

## 2018-11-16 DIAGNOSIS — R509 Fever, unspecified: Secondary | ICD-10-CM | POA: Diagnosis not present

## 2019-04-10 DIAGNOSIS — R197 Diarrhea, unspecified: Secondary | ICD-10-CM | POA: Diagnosis not present

## 2019-05-22 ENCOUNTER — Other Ambulatory Visit: Payer: Self-pay | Admitting: Interventional Cardiology

## 2019-06-07 ENCOUNTER — Encounter: Payer: Self-pay | Admitting: Interventional Cardiology

## 2019-06-07 ENCOUNTER — Ambulatory Visit (INDEPENDENT_AMBULATORY_CARE_PROVIDER_SITE_OTHER): Payer: BC Managed Care – PPO | Admitting: Interventional Cardiology

## 2019-06-07 ENCOUNTER — Other Ambulatory Visit: Payer: Self-pay

## 2019-06-07 VITALS — BP 122/84 | HR 70 | Ht 73.0 in | Wt 231.4 lb

## 2019-06-07 DIAGNOSIS — I10 Essential (primary) hypertension: Secondary | ICD-10-CM | POA: Diagnosis not present

## 2019-06-07 DIAGNOSIS — I2510 Atherosclerotic heart disease of native coronary artery without angina pectoris: Secondary | ICD-10-CM

## 2019-06-07 DIAGNOSIS — T148XXA Other injury of unspecified body region, initial encounter: Secondary | ICD-10-CM

## 2019-06-07 DIAGNOSIS — E782 Mixed hyperlipidemia: Secondary | ICD-10-CM

## 2019-06-07 MED ORDER — SILDENAFIL CITRATE 100 MG PO TABS: 50.0000 mg | ORAL_TABLET | ORAL | 0 refills | Status: DC | PRN

## 2019-06-07 MED ORDER — CLOPIDOGREL BISULFATE 75 MG PO TABS: ORAL_TABLET | ORAL | 3 refills | Status: DC

## 2019-06-07 MED ORDER — ATENOLOL 50 MG PO TABS: 50.0000 mg | ORAL_TABLET | Freq: Every day | ORAL | 3 refills | Status: DC

## 2019-06-07 MED ORDER — ROSUVASTATIN CALCIUM 40 MG PO TABS: 40.0000 mg | ORAL_TABLET | Freq: Every day | ORAL | 3 refills | Status: DC

## 2019-06-07 NOTE — Patient Instructions (Signed)

## 2019-06-07 NOTE — Progress Notes (Signed)
Cardiology Office Note   Date:  06/07/2019   ID:  Andrew Clark, DOB [birth year], MRN [id number]  PCP:  Andrew Bussing, MD    No chief complaint on file.  CAD  Wt Readings from Last 3 Encounters:  06/07/19 231 lb 6.4 oz (105 kg)  05/24/18 226 lb 3.2 oz (102.6 kg)  12/20/16 235 lb (106.6 kg)       History of Present Illness: Andrew Clark is a 58 y.o. male   who had an LAD stent in 2007. He has been taking DAPT since that time.  Prior to stent, he had typical angina while at work. He was SOB, left arm numbness, chest pressure, and began getting cold and clammy. He had to stop walking before getting to his office.   Exercise helps him sleep. He stopped smoking years ago.  He lost weight in the past.   Denies : Chest pain. Dizziness. Leg edema. Nitroglycerin use. Orthopnea. Palpitations. Paroxysmal nocturnal dyspnea. Shortness of breath. Syncope.   Using a home exercise machine and walking.  No angina.  No bleeding issues.  He still has trouble sleeping despite using melatonin.       Past Medical History:  Diagnosis Date  . ASCVD (arteriosclerotic cardiovascular disease)    (single vessel sten) Dr. Amil Clark 2007  . Coronary atherosclerosis of native coronary artery   . Essential hypertension, benign   . Hyperplastic colon polyp    repeat 10 years Dr. Bosie Clark  . Other and unspecified hyperlipidemia     No past surgical history on file.   Current Outpatient Medications  Medication Sig Dispense Refill  . atenolol (TENORMIN) 50 MG tablet Take 50 mg by mouth daily.     . clopidogrel (PLAVIX) 75 MG tablet TAKE 1 TABLET BY MOUTH EVERY DAY. Please make overdue appt with Dr. Eldridge Clark before anymore refills. 1st attempt 30 tablet 0  . CRESTOR 40 MG tablet Take 40 mg by mouth daily.     Marland Kitchen VIAGRA 100 MG tablet Take 50 mg by mouth as needed for erectile dysfunction.      No current facility-administered medications for this visit.     Allergies:   Sulfamethoxazole and  Sulfonamide derivatives    Social History:  The patient  reports that he has quit smoking. He has never used smokeless tobacco. He reports that he does not use drugs.   Family History:  The patient's family history includes CAD in his father; Heart disease in his father; Hypertension in his father.    ROS:  Please see the history of present illness.   Otherwise, review of systems are positive for .   All other systems are reviewed and negative.    PHYSICAL EXAM: VS:  BP 122/84   Pulse 70   Ht 6\' 1"  (1.854 m)   Wt 231 lb 6.4 oz (105 kg)   SpO2 96%   BMI 30.53 kg/m  , BMI Body mass index is 30.53 kg/m. GEN: Well nourished, well developed, in no acute distress  HEENT: normal  Neck: no JVD, carotid bruits, or masses Cardiac: RRR; no murmurs, rubs, or gallops,no edema  Respiratory:  clear to auscultation bilaterally, normal work of breathing GI: soft, nontender, nondistended, + BS MS: no deformity or atrophy  Skin: warm and dry, no rash Neuro:  Strength and sensation are intact Psych: euthymic mood, full affect   EKG:   The ekg ordered today demonstrates NSR, no ST changes   Recent Labs: No results found for requested  labs within last 8760 hours.   Lipid Panel No results found for: CHOL, TRIG, HDL, CHOLHDL, VLDL, LDLCALC, LDLDIRECT   Other studies Reviewed: Additional studies/ records that were reviewed today with results demonstrating: .   ASSESSMENT AND PLAN:  1. CAD: No angina.  COntinue aggressive secondary prevention.   2. HTN: The current medical regimen is effective;  continue present plan and medications. 3. Hyperlipidemia: Needs repeat labs.  Will see his PMD. 4. Bruising: resolved off of aspirin.    Current medicines are reviewed at length with the patient today.  The patient concerns regarding his medicines were addressed.  The following changes have been made:  No change  Labs/ tests ordered today include:  No orders of the defined types were placed  in this encounter.   Recommend 150 minutes/week of aerobic exercise Low fat, low carb, high fiber diet recommended  Disposition:   FU in 1 year   Signed, Andrew Grooms, MD  06/07/2019 3:12 PM    [facility] [place], [place], [place]  [address] Phone: [phone]; Fax: [phone]

## 2019-07-03 DIAGNOSIS — Z23 Encounter for immunization: Secondary | ICD-10-CM | POA: Diagnosis not present

## 2019-07-03 DIAGNOSIS — L570 Actinic keratosis: Secondary | ICD-10-CM | POA: Diagnosis not present

## 2019-07-03 DIAGNOSIS — L821 Other seborrheic keratosis: Secondary | ICD-10-CM | POA: Diagnosis not present

## 2019-07-03 DIAGNOSIS — D225 Melanocytic nevi of trunk: Secondary | ICD-10-CM | POA: Diagnosis not present

## 2019-07-03 DIAGNOSIS — L814 Other melanin hyperpigmentation: Secondary | ICD-10-CM | POA: Diagnosis not present

## 2019-07-09 DIAGNOSIS — I10 Essential (primary) hypertension: Secondary | ICD-10-CM | POA: Diagnosis not present

## 2019-07-09 DIAGNOSIS — R7301 Impaired fasting glucose: Secondary | ICD-10-CM | POA: Diagnosis not present

## 2019-07-09 DIAGNOSIS — I2510 Atherosclerotic heart disease of native coronary artery without angina pectoris: Secondary | ICD-10-CM | POA: Diagnosis not present

## 2019-07-09 DIAGNOSIS — E7800 Pure hypercholesterolemia, unspecified: Secondary | ICD-10-CM | POA: Diagnosis not present

## 2019-07-12 DIAGNOSIS — Z23 Encounter for immunization: Secondary | ICD-10-CM | POA: Diagnosis not present

## 2019-07-19 DIAGNOSIS — Z125 Encounter for screening for malignant neoplasm of prostate: Secondary | ICD-10-CM | POA: Diagnosis not present

## 2019-07-19 DIAGNOSIS — I10 Essential (primary) hypertension: Secondary | ICD-10-CM | POA: Diagnosis not present

## 2019-07-19 DIAGNOSIS — Z6831 Body mass index (BMI) 31.0-31.9, adult: Secondary | ICD-10-CM | POA: Diagnosis not present

## 2019-07-19 DIAGNOSIS — R7301 Impaired fasting glucose: Secondary | ICD-10-CM | POA: Diagnosis not present

## 2019-07-19 DIAGNOSIS — E7800 Pure hypercholesterolemia, unspecified: Secondary | ICD-10-CM | POA: Diagnosis not present

## 2019-08-08 ENCOUNTER — Encounter

## 2019-08-08 ENCOUNTER — Ambulatory Visit: Payer: BLUE CROSS/BLUE SHIELD | Admitting: Interventional Cardiology

## 2019-09-10 DIAGNOSIS — Z20828 Contact with and (suspected) exposure to other viral communicable diseases: Secondary | ICD-10-CM | POA: Diagnosis not present

## 2019-09-13 DIAGNOSIS — Z23 Encounter for immunization: Secondary | ICD-10-CM | POA: Diagnosis not present

## 2020-01-07 IMAGING — CR DG WRIST COMPLETE 3+V*R*
4 series · 4 of 4 positions shown · non-contrast
Comparison: No prior.

CLINICAL DATA: Right wrist pain.

EXAM:
RIGHT WRIST - COMPLETE 3+ VIEW

[x wrist pa right]
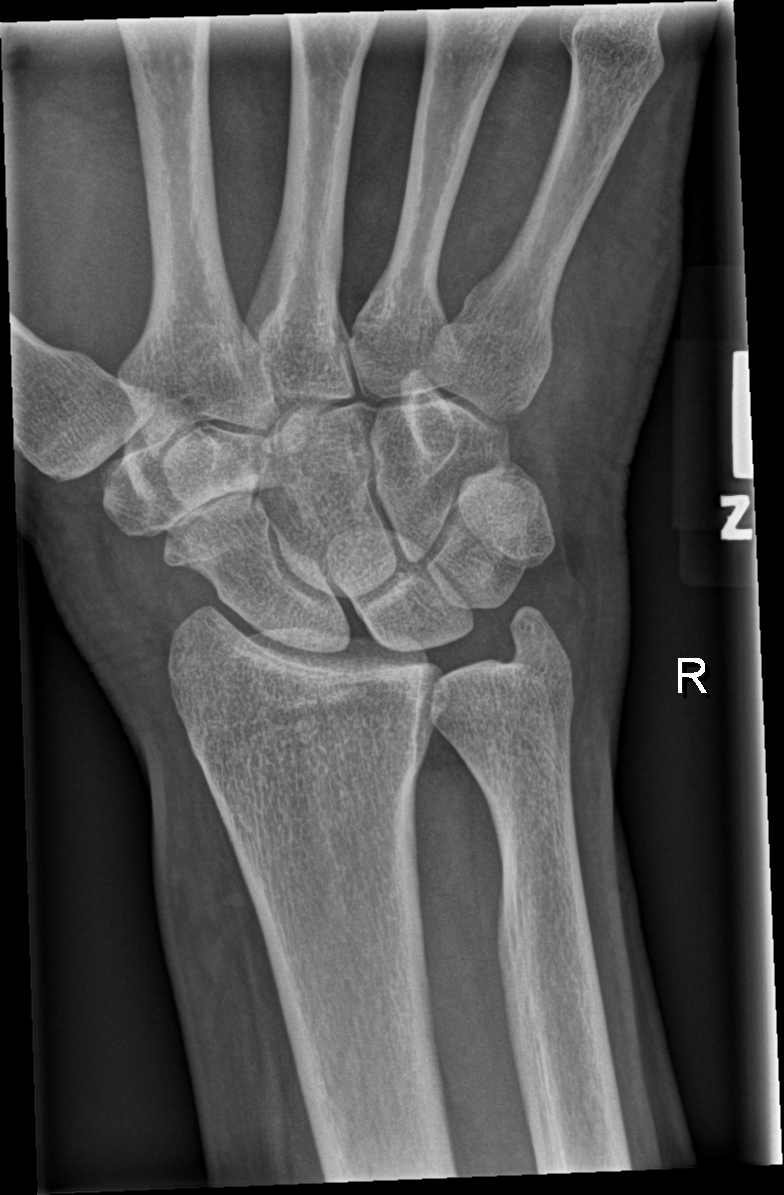

[x wrist obl right]
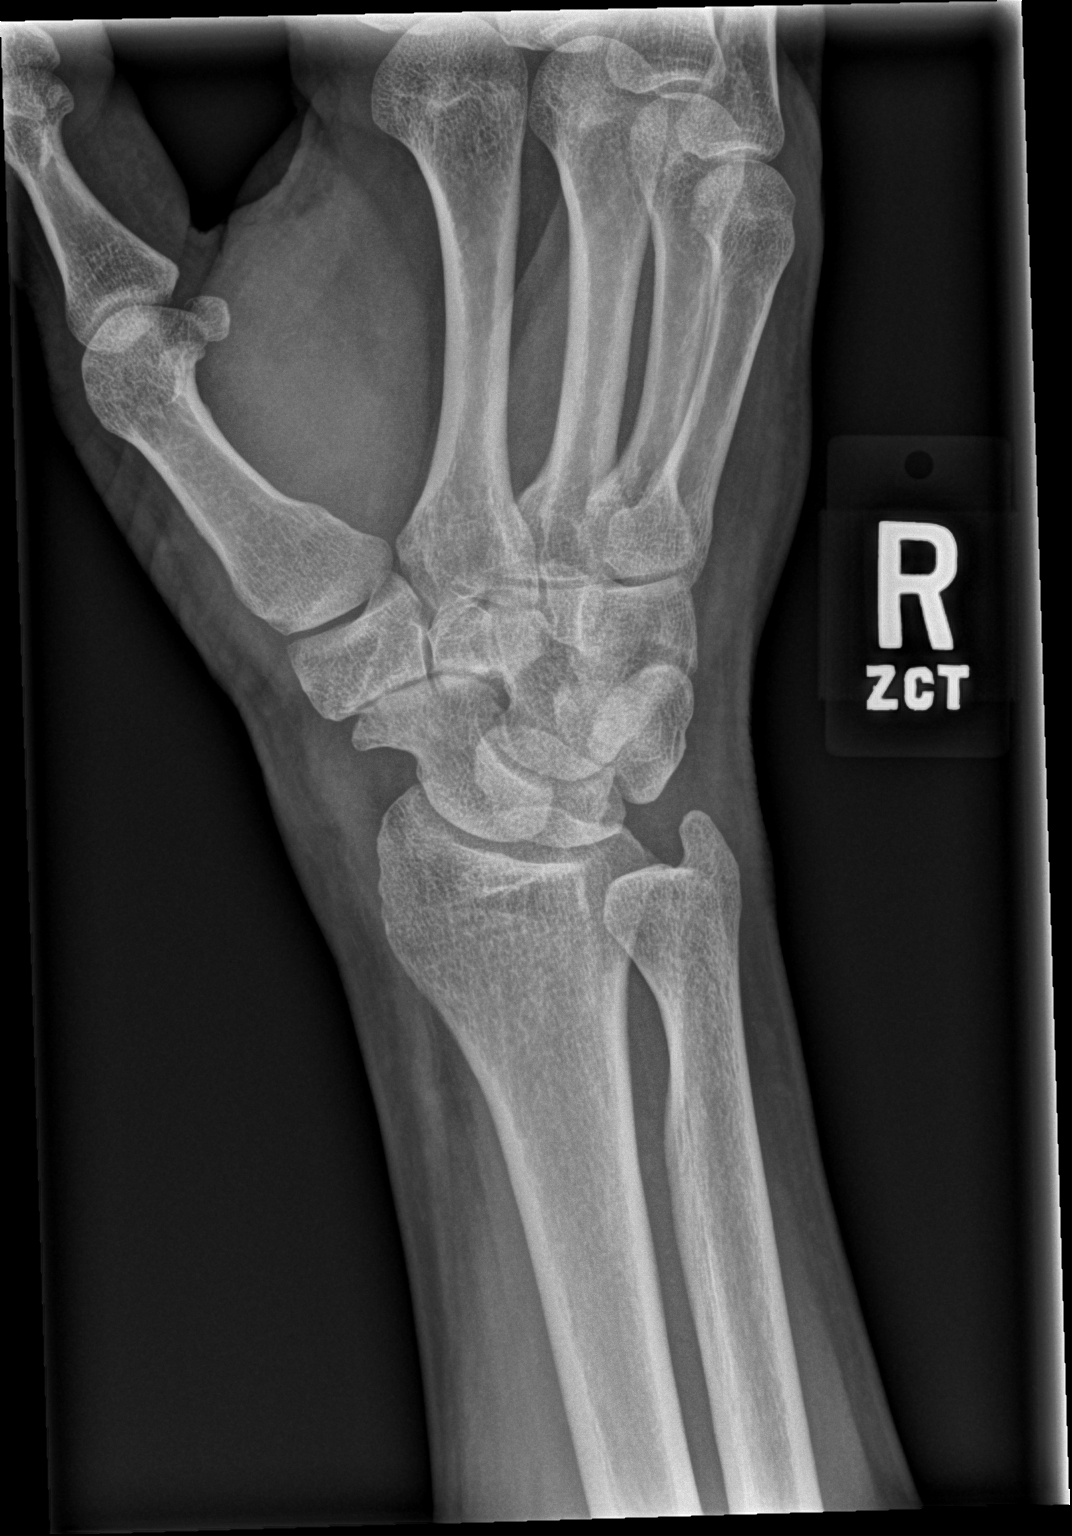

[x wrist lat right]
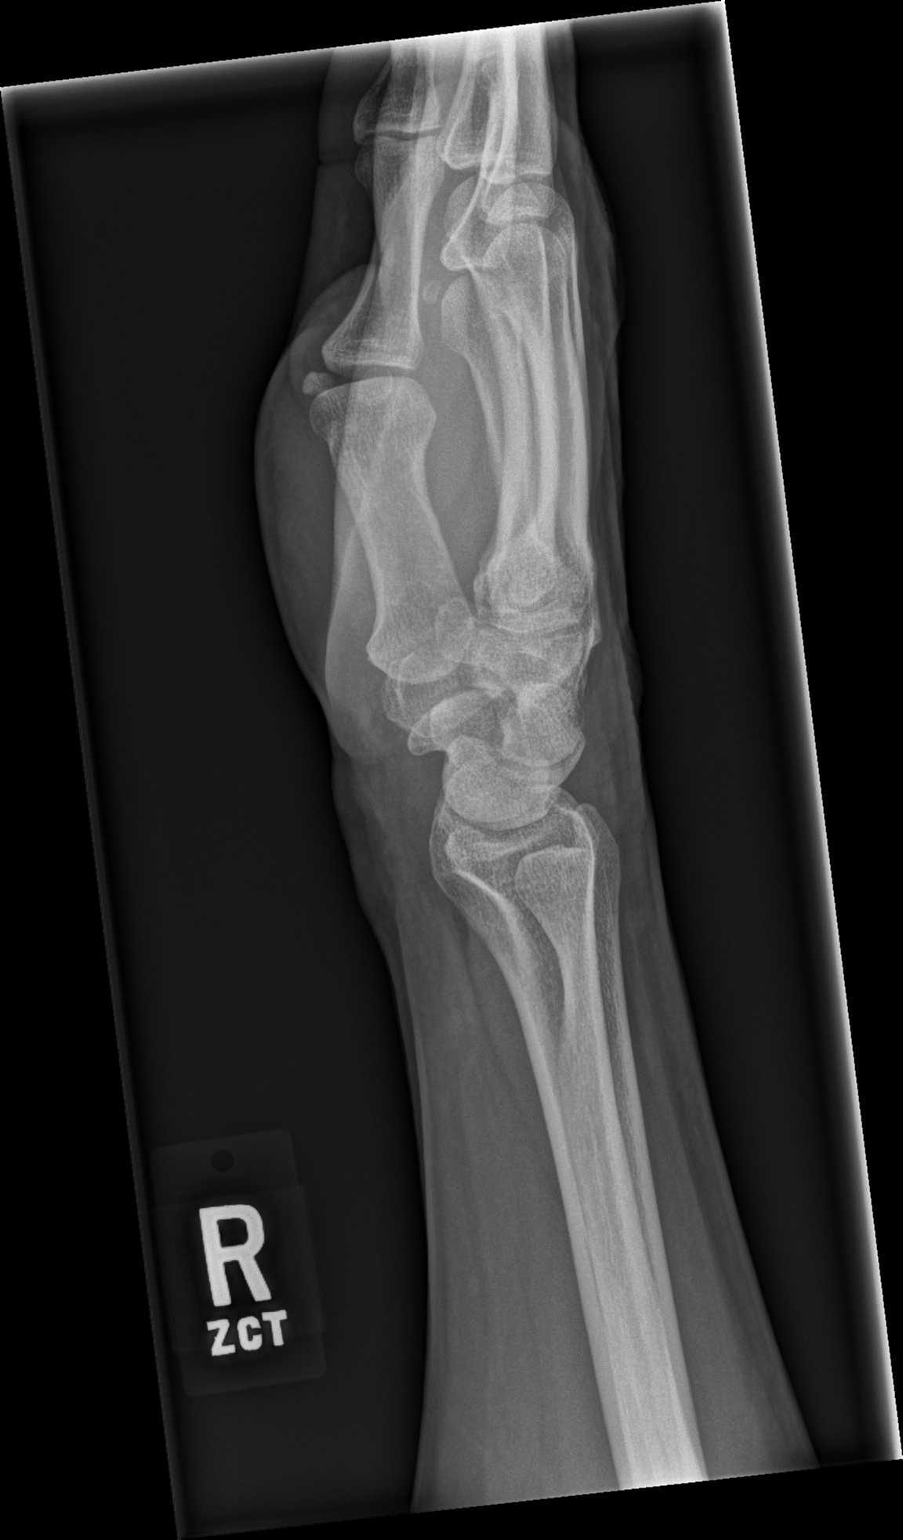

[x wrist navicular view right]
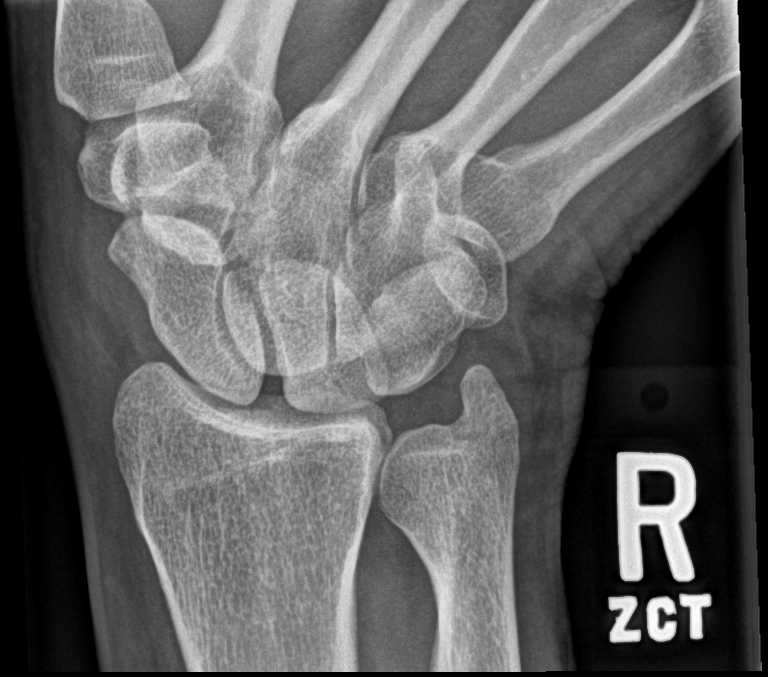

[4 of 4 positions shown; findings below may reference images not displayed]

FINDINGS: No acute bony or joint abnormality identified. No evidence of
fracture or dislocation. No focal bony abnormality. Soft tissue
structures are unremarkable.
IMPRESSION: No acute or focal abnormality.

## 2020-05-14 ENCOUNTER — Other Ambulatory Visit: Payer: Self-pay

## 2020-05-14 MED ORDER — CLOPIDOGREL BISULFATE 75 MG PO TABS: ORAL_TABLET | ORAL | 0 refills | Status: DC

## 2020-05-14 MED ORDER — ROSUVASTATIN CALCIUM 40 MG PO TABS: 40.0000 mg | ORAL_TABLET | Freq: Every day | ORAL | 0 refills | Status: DC

## 2020-05-18 ENCOUNTER — Other Ambulatory Visit: Payer: Self-pay

## 2020-05-18 MED ORDER — ATENOLOL 50 MG PO TABS: 50.0000 mg | ORAL_TABLET | Freq: Every day | ORAL | 0 refills | Status: DC

## 2020-06-06 ENCOUNTER — Other Ambulatory Visit: Payer: Self-pay | Admitting: Interventional Cardiology

## 2020-06-16 ENCOUNTER — Other Ambulatory Visit: Payer: Self-pay | Admitting: Interventional Cardiology

## 2020-06-16 NOTE — Progress Notes (Signed)
Cardiology Office Note   Date:  06/17/2020   ID:  [name], DOB 1960-09-21, MRN [id number]  PCP:  Darrow Bussing, MD    No chief complaint on file.  CAD  Wt Readings from Last 3 Encounters:  06/17/20 238 lb 6.4 oz (108.1 kg)  06/07/19 231 lb 6.4 oz (105 kg)  05/24/18 226 lb 3.2 oz (102.6 kg)       History of Present Illness: Ramirez Fullbright is a 59 y.o. male  who had an LAD stent in 2007.  Angina was chest pressure, left arm pain, nausea.  He has been taking DAPT since that time. 2007 cath report: LAD stent placed, taxus. Mild disease in the circumflex and right.   Prior to stent, he had typical angina while at work. He was SOB, left arm numbness, chest pressure, and began getting cold and clammy. He had to stop walking before getting to his office.   Exercise helps him sleep. He stopped smoking years ago.  He lost weight in the past.  has had trouble sleeping in the past as well.  Tried melatonin.  Had bruising while on aspirin.   Since the last visit, he had his shots for COVID and shingles.   Difficulty losing weight.  Walks regularly and eats healthy and hs trouble losing weight.  He is going to try to lose weight through a weight loss clinic.     Past Medical History:  Diagnosis Date  . ASCVD (arteriosclerotic cardiovascular disease)    (single vessel sten) Dr. Amil Amen 2007  . Coronary atherosclerosis of native coronary artery   . Essential hypertension, benign   . Hyperplastic colon polyp    repeat 10 years Dr. Bosie Clos  . Other and unspecified hyperlipidemia     History reviewed. No pertinent surgical history.   Current Outpatient Medications  Medication Sig Dispense Refill  . albuterol (VENTOLIN HFA) 108 (90 Base) MCG/ACT inhaler Inhale 1 puff into the lungs as needed for shortness of breath.    Marland Kitchen atenolol (TENORMIN) 50 MG tablet Take 1 tablet (50 mg total) by mouth daily. Please make yearly appt with Dr. Eldridge Dace for October before anymore  refills. 1st attempt 90 tablet 0  . clopidogrel (PLAVIX) 75 MG tablet TAKE 1 TABLET BY MOUTH EVERY DAY. Please make yearly appt with Dr. Eldridge Dace for October for future refills. 1st attempt 90 tablet 0  . rosuvastatin (CRESTOR) 40 MG tablet Take 1 tablet (40 mg total) by mouth daily. Please make yearly appt with Dr. Eldridge Dace for October for future refills. 1st attempt 90 tablet 0  . sildenafil (VIAGRA) 100 MG tablet Take 0.5 tablets (50 mg total) by mouth as needed for erectile dysfunction. 10 tablet 0  . traZODone (DESYREL) 50 MG tablet Take 1 tablet by mouth as needed for sleep.     No current facility-administered medications for this visit.    Allergies:   Sulfamethoxazole and Sulfonamide derivatives    Social History:  The patient  reports that he has quit smoking. He has never used smokeless tobacco. He reports that he does not use drugs.   Family History:  The patient's family history includes CAD in his father; Heart disease in his father; Hypertension in his father.    ROS:  Please see the history of present illness.   Otherwise, review of systems are positive for difficulty losing weihgt.   All other systems are reviewed and negative.    PHYSICAL EXAM: VS:  BP (!) 142/86   Pulse  73   Ht 6\' 1"  (1.854 m)   Wt 238 lb 6.4 oz (108.1 kg)   SpO2 94%   BMI 31.45 kg/m  , BMI Body mass index is 31.45 kg/m. GEN: Well nourished, well developed, in no acute distress  HEENT: normal  Neck: no JVD, carotid bruits, or masses Cardiac: RRR; no murmurs, rubs, or gallops,no edema  Respiratory:  clear to auscultation bilaterally, normal work of breathing GI: soft, nontender, nondistended, + BS MS: no deformity or atrophy  Skin: warm and dry, no rash Neuro:  Strength and sensation are intact Psych: euthymic mood, full affect   EKG:   The ekg ordered today demonstrates normal ECG   Recent Labs: No results found for requested labs within last 8760 hours.   Lipid Panel No results  found for: CHOL, TRIG, HDL, CHOLHDL, VLDL, LDLCALC, LDLDIRECT   Other studies Reviewed: Additional studies/ records that were reviewed today with results demonstrating: labs reviewed .   ASSESSMENT AND PLAN:  1. CAD: No angina.  Continue aggressive secondary prevention.  Continue cardio exercises along with weight training.  2. HTN: The current medical regimen is effective;  continue present plan and medications. 3. Hyperlipidemia: LDL 58, TC 145, TG 312, HDL 39.  The current medical regimen is effective for LDL;  continue present plan and medications.  TG elevated despite Crestor 40 mg daily.  Will check with lipid clinic what to add for increased TG.  Healthy diet being followed.  4. Would be cautious with any stimulants or testosterone supplements that may be offered at his weight loss clinic appt.   Current medicines are reviewed at length with the patient today.  The patient concerns regarding his medicines were addressed.  The following changes have been made:  No change  Labs/ tests ordered today include:  No orders of the defined types were placed in this encounter.   Recommend 150 minutes/week of aerobic exercise Low fat, low carb, high fiber diet recommended  Disposition:   FU in 1 year   Signed, , MD  06/17/2020 9:07 AM    [facility] [address] [place], [place], [place]  [place] Phone: [phone]; Fax: [phone]

## 2020-06-17 ENCOUNTER — Ambulatory Visit (INDEPENDENT_AMBULATORY_CARE_PROVIDER_SITE_OTHER): Payer: 59 | Admitting: Interventional Cardiology

## 2020-06-17 ENCOUNTER — Encounter: Payer: Self-pay | Admitting: Interventional Cardiology

## 2020-06-17 ENCOUNTER — Other Ambulatory Visit: Payer: Self-pay

## 2020-06-17 VITALS — BP 142/86 | HR 73 | Ht 73.0 in | Wt 238.4 lb

## 2020-06-17 DIAGNOSIS — E782 Mixed hyperlipidemia: Secondary | ICD-10-CM

## 2020-06-17 DIAGNOSIS — I2510 Atherosclerotic heart disease of native coronary artery without angina pectoris: Secondary | ICD-10-CM | POA: Diagnosis not present

## 2020-06-17 DIAGNOSIS — I10 Essential (primary) hypertension: Secondary | ICD-10-CM | POA: Diagnosis not present

## 2020-06-17 MED ORDER — CLOPIDOGREL BISULFATE 75 MG PO TABS: ORAL_TABLET | ORAL | 3 refills | Status: DC

## 2020-06-17 NOTE — Patient Instructions (Signed)

## 2020-06-18 ENCOUNTER — Telehealth: Payer: Self-pay

## 2020-06-18 NOTE — Telephone Encounter (Signed)
-----   Message from Cheree Ditto, [facility] sent at 06/17/2020  5:21 PM EDT ----- Charlene Brooke can you call and schedule him?  Thanks! ----- Message ----- From: Corky Crafts, MD Sent: 06/17/2020   9:17 AM EDT To: Cheree Ditto, [facility]  Thayer Ohm, please address elevated TG

## 2020-06-18 NOTE — Telephone Encounter (Signed)
Called and scheduled appt w/pharmd

## 2020-06-23 NOTE — Progress Notes (Signed)
Patient ID: Andrew Clark                 DOB: [birth year]                    MRN: [id number]     HPI: Andrew Clark is a 59 y.o. male patient referred to lipid clinic by Dr. Eldridge Dace. PMH is significant for CAD (s/p LAD stent 2007) w/ angina, HTN. No documented history of pancreatitis.  The patient is being referred to [place] specifically for management of his elevated triglycerides. His triglycerides remain elevated despite taking atorvastatin 40mg  daily since  2015. Of note, per the patient's last visit 05/24/2020 with Dr. 05/26/2020, the patient endorsed significant recent diet changes and exercise increase that has been accompanied by weight loss.   Patient is doing well today. He understands that we will be talking about triglycerides today. When talking about possible reasons for elevated triglycerides, the patient immediately identified that his triglycerides were likely elevated from poor diet and increased alcohol intake during the months of April and May as a result of some personal issues, which is when his last lipid panel was obtained. He confirms that he has since returned to his baseline alcohol use and dietary choices. He reports recently enrolling in a wellness clinic that has placed him on a very low carbohydrate diet. He also reports very regular exercise. He states that he has goals to lose ~10-20lb.    Current Medications: rosuvastatin 40mg  daily (since 2015) Intolerances: none Risk Factors: HTN, obesity, angina  LDL goal: <70  Diet:  - recent diet changes x 1 wk - he eats breakfast with three proteins & fruit - 2 proteins and veggie for lunch and dinner - 100-120 ounces of water  - eating low carb diet now after being seen at a wellness clinic   Exercise:  - increased recently - cardio (3 mile fast walk) - lifts weights 3-4s/wk  Family History: The patient's family history includes CAD in his father; Heart disease in his father; Hypertension in his father.   Social History:  The patient  reports that he has quit smoking. He has never used smokeless tobacco. He reports that he does not use drugs.  - Drinking habits have not changed recently, but did have a spike in May  - A few drinks/night, previously drinking red wine & beer, but recently stopped - now endorses bourbon & vodka on a roughly daily basis (this is his normal)  Labs: LDL 58, TC 145, TG 312, HDL 39 (01/10/20) - confirms fasting at this time to his knowledge   No LFTs documented  Past Medical History:  Diagnosis Date  . ASCVD (arteriosclerotic cardiovascular disease)    (single vessel sten) Dr. June 2007  . Coronary atherosclerosis of native coronary artery   . Essential hypertension, benign   . Hyperplastic colon polyp    repeat 10 years Dr. Amil Amen  . Other and unspecified hyperlipidemia     Current Outpatient Medications on File Prior to Visit  Medication Sig Dispense Refill  . albuterol (VENTOLIN HFA) 108 (90 Base) MCG/ACT inhaler Inhale 1 puff into the lungs as needed for shortness of breath.    2008 atenolol (TENORMIN) 50 MG tablet TAKE 1 TABLET BY MOUTH EVERY DAY 90 tablet 3  . clopidogrel (PLAVIX) 75 MG tablet TAKE 1 TABLET BY MOUTH EVERY DAY. 90 tablet 3  . rosuvastatin (CRESTOR) 40 MG tablet Take 1 tablet (40 mg total) by mouth daily. Please make  yearly appt with Dr. Eldridge Dace for October for future refills. 1st attempt 90 tablet 0  . sildenafil (VIAGRA) 100 MG tablet Take 0.5 tablets (50 mg total) by mouth as needed for erectile dysfunction. 10 tablet 0  . traZODone (DESYREL) 50 MG tablet Take 1 tablet by mouth as needed for sleep.     No current facility-administered medications on file prior to visit.    Allergies  Allergen Reactions  . Sulfamethoxazole     Patient is unsure of reaction, had reaction as a child  . Sulfonamide Derivatives     Assessment/Plan:  1. Hyperlipidemia -  The patient presented with elevated triglycerides at 312 (>150), but to-goal LDL at 58  (<70). This isolated hypertriglyceridemia that represents a sudden increase from his baseline, and his association with a sudden worsening of diet & increase in alcohol use, suggest that the patient's triglycerides can be controlled through diet and lifestyle modification alone. As the patient has already made these changes, will get repeat fasting lipid panel to ensure that his triglycerides have decreased accordingly. Per patient request, will delay repeat lipid panel to see the full effects of his lifestyle modifications (labs scheduled Nov 9th). Vascepa was discussed as a future medication option if further triglyceride lowering is needed in the future.  The patient was counseled on diet changes that are associated with a decrease in triglycerides as well as their association with alcohol intake. Requested that the patient limit alcohol to 1-2 drinks max per day (1 ounce of bourbon/vodka). The patient was commended on enrollment at the wellness clinic and exercise regime.   Thank you,  Colin Broach, Pharmacy Student Class of 2020  Laural Golden, PharmD, BCACP, CDCES [facility] [address] [place], [place] [address] Phone: [phone]; Fax: [phone] 06/24/2020 4:40 PM

## 2020-06-24 ENCOUNTER — Other Ambulatory Visit: Payer: Self-pay

## 2020-06-24 ENCOUNTER — Ambulatory Visit (INDEPENDENT_AMBULATORY_CARE_PROVIDER_SITE_OTHER): Payer: 59 | Admitting: Pharmacist

## 2020-06-24 DIAGNOSIS — E782 Mixed hyperlipidemia: Secondary | ICD-10-CM

## 2020-06-24 NOTE — Patient Instructions (Addendum)
Thank you for visiting [place] today!  As we spoke about, your triglycerides are elevated today. This is likely attributed to diet and alcohol intake at the time that we got labs on you. It sounds like you have started making some great diet and exercise changes, so keep up the great work.  We will have you come back for fasting lipid labs on November 9th. You can come in anytime after 07:30. If your triglyceride are still elevated, we talked about starting Vascepa in the future.   If you need [place] for any reason, please call [phone].   Thank you! Abby

## 2020-07-14 ENCOUNTER — Other Ambulatory Visit: Payer: Self-pay

## 2020-07-14 ENCOUNTER — Other Ambulatory Visit: Payer: 59

## 2020-07-14 DIAGNOSIS — E782 Mixed hyperlipidemia: Secondary | ICD-10-CM

## 2020-07-14 LAB — LIPID PANEL
Chol/HDL Ratio: 3.6 ratio (ref 0.0–5.0)
Cholesterol, Total: 141 mg/dL (ref 100–199)
HDL: 39 mg/dL — ABNORMAL LOW (ref >39)
LDL Chol Calc (NIH): 79 mg/dL (ref 0–99)
Triglycerides: 131 mg/dL (ref 0–149)
VLDL Cholesterol Cal: 23 mg/dL (ref 5–40)

## 2020-08-15 ENCOUNTER — Other Ambulatory Visit: Payer: Self-pay | Admitting: Interventional Cardiology

## 2020-10-22 ENCOUNTER — Other Ambulatory Visit: Payer: Self-pay | Admitting: Interventional Cardiology

## 2020-10-22 NOTE — Telephone Encounter (Signed)
Refill sent to pharmacy.   

## 2020-10-23 ENCOUNTER — Telehealth: Payer: Self-pay | Admitting: *Deleted

## 2020-10-23 NOTE — Telephone Encounter (Signed)
   [facility] Pre-operative Risk Assessment    HEARTCARE STAFF: - Please ensure there is not already an duplicate clearance open for this procedure. - Under Visit Info/Reason for Call, type in Other and utilize the format Clearance MM/DD/YY or Clearance TBD. Do not use dashes or single digits. - If request is for dental extraction, please clarify the # of teeth to be extracted.  Request for surgical clearance:  1. What type of surgery is being performed? PLACEMENT OF 1 DENTAL IMPLANT ON TOOTH # 3  2. When is this surgery scheduled? TBD   3. What type of clearance is required (medical clearance vs. Pharmacy clearance to hold med vs. Both)? MEDICAL  4. Are there any medications that need to be held prior to surgery and how long? PLAVIX; PER CLEARANCE REQUEST 3-5 DAYS PRIOR   5. Practice name and name of physician performing surgery? [facility]; DR. Arnetha Courser, DDS    6. What is the office phone number? [phone]   7.   What is the office fax number? [phone]  8.   Anesthesia type (None, local, MAC, general) ? IV SEDATION   Julaine Hua 10/23/2020, 3:04 PM  _________________________________________________________________   (provider comments below)

## 2020-10-23 NOTE — Telephone Encounter (Signed)
OK to hold Plavix 5 days prior to procedure  JV

## 2020-10-23 NOTE — Telephone Encounter (Signed)
   Primary Cardiologist: Lance Muss, MD  Chart reviewed as part of pre-operative protocol coverage. Mr.  Andrew Clark has a hx of LAD stent in 2007 and has been maintained DAPT since that time. Most recent ischemic eval with ETT 11/2015 which was negative. He was last seen 06/17/20 by Dr. Eldridge Dace doing well from a cardiac perspective and referred to lipid clinic for elevated triglycerides.   Will route to primary cardiologist for recommendations regarding request to hold Plavix 3-5 days prior to placement of dental implant.   Alver Sorrow, NP 10/23/2020, 3:22 PM

## 2020-10-26 NOTE — Telephone Encounter (Addendum)
   Primary Cardiologist: Lance Muss, MD  Chart reviewed as part of pre-operative protocol coverage. Mr.  Andrew Clark has a hx of LAD stent in 2007 and has been maintained DAPT since that time. Most recent ischemic eval with ETT 11/2015 which was negative. He was last seen 06/17/20 by Dr. Eldridge Dace doing well from a cardiac perspective and referred to lipid clinic for elevated triglycerides.   Per his primary cardiologist, he may hold Plavix 5 days prior to planned procedure.   SBE prophylaxis is not required for the patient from a cardiac standpoint.  I will route this recommendation to the requesting party via Epic fax function and remove from pre-op pool.  Please call with questions.  Alver Sorrow, NP 10/26/2020, 10:36 AM

## 2021-06-07 ENCOUNTER — Other Ambulatory Visit: Payer: Self-pay | Admitting: Interventional Cardiology

## 2021-06-14 ENCOUNTER — Other Ambulatory Visit: Payer: Self-pay | Admitting: Interventional Cardiology

## 2021-07-10 ENCOUNTER — Other Ambulatory Visit: Payer: Self-pay | Admitting: Interventional Cardiology

## 2021-07-21 ENCOUNTER — Telehealth: Payer: Self-pay | Admitting: Interventional Cardiology

## 2021-07-21 MED ORDER — ATENOLOL 50 MG PO TABS: ORAL_TABLET | ORAL | 1 refills | Status: DC

## 2021-07-21 MED ORDER — CLOPIDOGREL BISULFATE 75 MG PO TABS
75.0000 mg | ORAL_TABLET | Freq: Every day | ORAL | 1 refills | Status: DC
Start: 2021-07-21 — End: 2022-01-24

## 2021-07-21 NOTE — Telephone Encounter (Signed)
*  STAT* If patient is at the pharmacy, call can be transferred to refill team.   1. Which medications need to be refilled? (please list name of each medication and dose if known)  clopidogrel (PLAVIX) 75 MG tablet atenolol (TENORMIN) 50 MG tablet  2. Which pharmacy/location (including street and city if local pharmacy) is medication to be sent to? [facility] [phone] [facility] - [place], [place] - [address]  3. Do they need a 30 day or 90 day supply?  Patient states he has a 1 week supply of medication remaining. He is hopeful for enough medication to last him until his appointment on 11/29/21 with Dr. Eldridge Dace.

## 2021-07-21 NOTE — Telephone Encounter (Signed)
Pt's medications were sent to pt's pharmacy as requested. Confirmation received.  

## 2021-08-10 ENCOUNTER — Other Ambulatory Visit: Payer: Self-pay | Admitting: Interventional Cardiology

## 2021-11-08 ENCOUNTER — Other Ambulatory Visit: Payer: Self-pay | Admitting: Interventional Cardiology

## 2021-11-28 NOTE — Progress Notes (Signed)
?  ?Cardiology Office Note ? ? ?Date:  11/29/2021  ? ?ID:  Andrew Clark, DOB 1961-03-07, MRN [id number] ? ?PCP:  Lujean Amel, MD  ? ? ?Chief Complaint  ?Patient presents with  ? Follow-up  ? ?CAD ? ?Wt Readings from Last 3 Encounters:  ?11/29/21 235 lb (106.6 kg)  ?06/17/20 238 lb 6.4 oz (108.1 kg)  ?06/07/19 231 lb 6.4 oz (105 kg)  ?  ? ?  ?History of Present Illness: ?Andrew Clark is a 61 y.o. male   who had an LAD stent in 2007.  Angina was chest pressure, left arm pain, nausea.  He has been taking DAPT since that time. 2007 cath report: LAD stent placed, taxus. Mild disease in the circumflex and right.  ?  ?Prior to stent, he had typical angina while at work. He was SOB, left arm numbness, chest pressure, and began getting cold and clammy. He had to stop walking before getting to his office.  ?  ? Exercise helps him sleep. ?He stopped smoking years ago. ?  ?He lost weight in the past.  has had trouble sleeping in the past as well.  Tried melatonin.  Had bruising while on aspirin.  ?  ?Since the last visit, he had his shots for COVID and shingles.  ?  ?Difficulty losing weight.  Joined a weight loss clinic in 2022. ? ? ? Denies : Chest pain. Dizziness. Leg edema. Nitroglycerin use. Orthopnea. Palpitations. Paroxysmal nocturnal dyspnea. Shortness of breath. Syncope.  ? ? ?He continues to exercise and try to eat healthy.  He does not notice much weight loss.  It is frustrating. ? ? ? ?Past Medical History:  ?Diagnosis Date  ? ASCVD (arteriosclerotic cardiovascular disease)   ? (single vessel sten) Dr. Leonia Reeves 2007  ? Coronary atherosclerosis of native coronary artery   ? Essential hypertension, benign   ? Hyperplastic colon polyp   ? repeat 10 years Dr. Michail Sermon  ? Other and unspecified hyperlipidemia   ? ? ?No past surgical history on file. ? ? ?Current Outpatient Medications  ?Medication Sig Dispense Refill  ? albuterol (VENTOLIN HFA) 108 (90 Base) MCG/ACT inhaler Inhale 1 puff into the lungs as needed for  shortness of breath.    ? atenolol (TENORMIN) 50 MG tablet TAKE 1 TABLET BY MOUTH DAILY. Please keep upcoming appt in March 2023 with Dr. Irish Lack before anymore refills. Thank you 90 tablet 1  ? clopidogrel (PLAVIX) 75 MG tablet Take 1 tablet (75 mg total) by mouth daily. Please keep upcoming appt in March 2023 with Dr. Irish Lack before anymore refills. Thank you 90 tablet 1  ? rosuvastatin (CRESTOR) 40 MG tablet TAKE 1 TABLET DAILY. MUST KEEP 11/2021 APPT FOR MORE REFILLS. 30 tablet 0  ? sildenafil (VIAGRA) 100 MG tablet TAKE ONE HALF BY MOUT AS NEEDED FOR ERECTILE DYSFUNCTION 10 tablet 3  ? traZODone (DESYREL) 50 MG tablet Take 1 tablet by mouth as needed for sleep.    ? ?No current facility-administered medications for this visit.  ? ? ?Allergies:   Sulfamethoxazole and Sulfonamide derivatives  ? ? ?Social History:  The patient  reports that he has quit smoking. He has never used smokeless tobacco. He reports that he does not use drugs.  ? ?Family History:  The patient's family history includes CAD in his father; Heart disease in his father; Hypertension in his father.  ? ? ?ROS:  Please see the history of present illness.   Otherwise, review of systems are positive for difficulty losing  weight.   All other systems are reviewed and negative.  ? ? ?PHYSICAL EXAM: ?VS:  BP 130/84   Pulse 87   Ht 6' 1" (1.854 m)   Wt 235 lb (106.6 kg)   SpO2 96%   BMI 31.00 kg/m?  , BMI Body mass index is 31 kg/m?. ?GEN: Well nourished, well developed, in no acute distress ?HEENT: normal ?Neck: no JVD, carotid bruits, or masses ?Cardiac: RRR; no murmurs, rubs, or gallops,no edema  ?Respiratory:  clear to auscultation bilaterally, normal work of breathing ?GI: soft, nontender, nondistended, + BS ?MS: no deformity or atrophy ?Skin: warm and dry, no rash ?Neuro:  Strength and sensation are intact ?Psych: euthymic mood, full affect ? ? ?EKG:   ?The ekg ordered today demonstrates NSR, no ST segment changes ? ? ?Recent Labs: ?No  results found for requested labs within last 8760 hours.  ? ?Lipid Panel ?   ?Component Value Date/Time  ? CHOL 141 07/14/2020 0818  ? TRIG 131 07/14/2020 0818  ? HDL 39 (L) 07/14/2020 0818  ? CHOLHDL 3.6 07/14/2020 0818  ? Burchard 79 07/14/2020 0818  ? ?  ?Other studies Reviewed: ?Additional studies/ records that were reviewed today with results demonstrating: LDL 79 in 2021. ? ? ?ASSESSMENT AND PLAN: ? ?CAD: Continue aggressive secondary prevention.  No angina.  Continue clopidogrel monotherapy.  Check CBC. ?HTN: Avoid excessive salt.  Exercise target as noted below.  Check electrolytes and renal function. ?Hyperlipidemia: Whole food, plant-based diet recommended.  High-fiber diet.  Avoid processed foods.  Needs fasting lipids. ?Obesity: Check A1C.  He is working hard to try to lose weight.  He eats healthy.  He exercises. ? ? ? ?Current medicines are reviewed at length with the patient today.  The patient concerns regarding his medicines were addressed. ? ?The following changes have been made:  No change ? ?Labs/ tests ordered today include: C-Met, lipids, CBC when fasting ?No orders of the defined types were placed in this encounter. ? ? ?Recommend 150 minutes/week of aerobic exercise ?Low fat, low carb, high fiber diet recommended ? ?Disposition:   FU in 1 year ? ? ?Signed, ?Larae Grooms, MD  ?11/29/2021 4:13 PM    ?[facility] ?[address], [place], [place]  [address] ?Phone: [phone]; Fax: [phone]  ? ?

## 2021-11-29 ENCOUNTER — Ambulatory Visit (INDEPENDENT_AMBULATORY_CARE_PROVIDER_SITE_OTHER): Payer: 59 | Admitting: Interventional Cardiology

## 2021-11-29 ENCOUNTER — Other Ambulatory Visit: Payer: Self-pay

## 2021-11-29 ENCOUNTER — Encounter: Payer: Self-pay | Admitting: Interventional Cardiology

## 2021-11-29 VITALS — BP 130/84 | HR 87 | Ht 73.0 in | Wt 235.0 lb

## 2021-11-29 DIAGNOSIS — E782 Mixed hyperlipidemia: Secondary | ICD-10-CM | POA: Diagnosis not present

## 2021-11-29 DIAGNOSIS — R7309 Other abnormal glucose: Secondary | ICD-10-CM | POA: Diagnosis not present

## 2021-11-29 DIAGNOSIS — I2510 Atherosclerotic heart disease of native coronary artery without angina pectoris: Secondary | ICD-10-CM | POA: Diagnosis not present

## 2021-11-29 DIAGNOSIS — I10 Essential (primary) hypertension: Secondary | ICD-10-CM | POA: Diagnosis not present

## 2021-11-29 NOTE — Patient Instructions (Signed)
Medication Instructions:  ?Your physician recommends that you continue on your current medications as directed. Please refer to the Current Medication list given to you today. ? ?*If you need a refill on your cardiac medications before your next appointment, please call your pharmacy* ? ? ?Lab Work: ?Your physician recommends that you return for lab work  on 12/02/21.  CBC, CMET, Lipids, A1C.  This will be fasting.  The lab opens at 7:30 AM ? ?If you have labs (blood work) drawn today and your tests are completely normal, you will receive your results only by: ?MyChart Message (if you have MyChart) OR ?A paper copy in the mail ?If you have any lab test that is abnormal or we need to change your treatment, we will call you to review the results. ? ? ?Testing/Procedures: ?none ? ? ?Follow-Up: ?At [facility], you and your health needs are our priority.  As part of our continuing mission to provide you with exceptional heart care, we have created designated Provider Care Teams.  These Care Teams include your primary Cardiologist (physician) and Advanced Practice Providers (APPs -  Physician Assistants and Nurse Practitioners) who all work together to provide you with the care you need, when you need it. ? ?We recommend signing up for the patient portal called "MyChart".  Sign up information is provided on this After Visit Summary.  MyChart is used to connect with patients for Virtual Visits (Telemedicine).  Patients are able to view lab/test results, encounter notes, upcoming appointments, etc.  Non-urgent messages can be sent to your provider as well.   ?To learn more about what you can do with MyChart, go to [email or web address].   ? ?Your next appointment:   ?12 month(s) ? ?The format for your next appointment:   ?In Person ? ?Provider:   ?Larae Grooms, MD   ? ? ?Other Instructions ?  ? ?

## 2021-12-02 ENCOUNTER — Other Ambulatory Visit: Payer: 59

## 2021-12-02 DIAGNOSIS — I2510 Atherosclerotic heart disease of native coronary artery without angina pectoris: Secondary | ICD-10-CM

## 2021-12-02 DIAGNOSIS — R7309 Other abnormal glucose: Secondary | ICD-10-CM

## 2021-12-02 DIAGNOSIS — I10 Essential (primary) hypertension: Secondary | ICD-10-CM

## 2021-12-02 DIAGNOSIS — E782 Mixed hyperlipidemia: Secondary | ICD-10-CM

## 2021-12-02 LAB — COMPREHENSIVE METABOLIC PANEL
ALT: 26 IU/L (ref 0–44)
AST: 29 IU/L (ref 0–40)
Albumin/Globulin Ratio: 2.3 — ABNORMAL HIGH (ref 1.2–2.2)
Albumin: 5.1 g/dL — ABNORMAL HIGH (ref 3.8–4.9)
Alkaline Phosphatase: 62 IU/L (ref 44–121)
BUN/Creatinine Ratio: 12 (ref 9–20)
BUN: 16 mg/dL (ref 6–24)
Bilirubin Total: 0.7 mg/dL (ref 0.0–1.2)
CO2: 24 mmol/L (ref 20–29)
Calcium: 9.9 mg/dL (ref 8.7–10.2)
Chloride: 95 mmol/L — ABNORMAL LOW (ref 96–106)
Creatinine, Ser: 1.39 mg/dL — ABNORMAL HIGH (ref 0.76–1.27)
Globulin, Total: 2.2 g/dL (ref 1.5–4.5)
Glucose: 110 mg/dL — ABNORMAL HIGH (ref 70–99)
Potassium: 4.4 mmol/L (ref 3.5–5.2)
Sodium: 135 mmol/L (ref 134–144)
Total Protein: 7.3 g/dL (ref 6.0–8.5)
eGFR: 58 mL/min/{1.73_m2} — ABNORMAL LOW (ref >59)

## 2021-12-02 LAB — LIPID PANEL
Chol/HDL Ratio: 3.8 ratio (ref 0.0–5.0)
Cholesterol, Total: 155 mg/dL (ref 100–199)
HDL: 41 mg/dL (ref >39)
LDL Chol Calc (NIH): 79 mg/dL (ref 0–99)
Triglycerides: 211 mg/dL — ABNORMAL HIGH (ref 0–149)
VLDL Cholesterol Cal: 35 mg/dL (ref 5–40)

## 2021-12-02 LAB — HEMOGLOBIN A1C
Est. average glucose Bld gHb Est-mCnc: 111 mg/dL
Hgb A1c MFr Bld: 5.5 % (ref 4.8–5.6)

## 2021-12-02 LAB — CBC
Hematocrit: 48.2 % (ref 37.5–51.0)
Hemoglobin: 16.5 g/dL (ref 13.0–17.7)
MCH: 31.5 pg (ref 26.6–33.0)
MCHC: 34.2 g/dL (ref 31.5–35.7)
MCV: 92 fL (ref 79–97)
Platelets: 298 10*3/uL (ref 150–450)
RBC: 5.23 x10E6/uL (ref 4.14–5.80)
RDW: 12.1 % (ref 11.6–15.4)
WBC: 9.4 10*3/uL (ref 3.4–10.8)

## 2021-12-06 ENCOUNTER — Other Ambulatory Visit: Payer: Self-pay | Admitting: Interventional Cardiology

## 2021-12-07 ENCOUNTER — Telehealth: Payer: Self-pay

## 2021-12-07 MED ORDER — ICOSAPENT ETHYL 1 G PO CAPS: 2.0000 g | ORAL_CAPSULE | Freq: Two times a day (BID) | ORAL | 3 refills | Status: DC

## 2021-12-07 NOTE — Telephone Encounter (Signed)
Outreach made to Pt. ? ?Advised of recommendation to start Vascepa. ? ?Pt in agreement. ? ?Prescription sent with copay card information to Pt's pharmacy. ?

## 2021-12-07 NOTE — Telephone Encounter (Signed)
-----   Message from Jettie Booze, MD sent at 12/07/2021  8:57 AM EDT ----- ?I agree with Megan's recs below regarding TG.  ?----- Message ----- ?From: Leeroy Bock, RPH-CPP ?Sent: 12/07/2021   7:37 AM EDT ?To: Jettie Booze, MD, # ? ?Yes I'd add on Vascepa 2g BID since he also has CAD - he should qualify for copay card as well since he has Pharmacist, community. ? ?

## 2021-12-09 ENCOUNTER — Telehealth: Payer: Self-pay | Admitting: *Deleted

## 2021-12-09 NOTE — Telephone Encounter (Signed)
Already addressed.  See 4/4/ phone note.   ?

## 2021-12-09 NOTE — Telephone Encounter (Signed)
?  Corky Crafts, MD   ?  ?I agree with Megan's recs below regarding TG ? ? ? ?Awilda Metro, RPH-CPP  ?12/07/2021  7:37 AM EDT   ?  ?Yes I'd add on Vascepa 2g BID since he also has CAD - he should qualify for copay card as well since he has Nurse, learning disability.  ? ?

## 2022-01-23 ENCOUNTER — Other Ambulatory Visit: Payer: Self-pay | Admitting: Interventional Cardiology

## 2022-07-16 ENCOUNTER — Other Ambulatory Visit: Payer: Self-pay | Admitting: Interventional Cardiology

## 2022-12-15 ENCOUNTER — Other Ambulatory Visit: Payer: Self-pay | Admitting: Interventional Cardiology

## 2023-01-17 ENCOUNTER — Other Ambulatory Visit: Payer: Self-pay | Admitting: Interventional Cardiology

## 2023-01-19 ENCOUNTER — Other Ambulatory Visit: Payer: Self-pay | Admitting: Interventional Cardiology

## 2023-02-06 ENCOUNTER — Other Ambulatory Visit: Payer: Self-pay

## 2023-02-06 MED ORDER — ICOSAPENT ETHYL 1 G PO CAPS
2.0000 g | ORAL_CAPSULE | Freq: Two times a day (BID) | ORAL | 0 refills | Status: DC
Start: 2023-02-06 — End: 2023-02-07

## 2023-02-07 MED ORDER — ICOSAPENT ETHYL 1 G PO CAPS: 2.0000 g | ORAL_CAPSULE | Freq: Two times a day (BID) | ORAL | 0 refills | Status: DC

## 2023-02-07 NOTE — Addendum Note (Signed)
Addended by: Margaret Pyle D on: 02/07/2023 01:19 PM   Modules accepted: Orders

## 2023-02-17 ENCOUNTER — Telehealth: Payer: Self-pay | Admitting: Interventional Cardiology

## 2023-02-17 MED ORDER — ATENOLOL 50 MG PO TABS: ORAL_TABLET | ORAL | 0 refills | Status: DC

## 2023-02-17 MED ORDER — CLOPIDOGREL BISULFATE 75 MG PO TABS: 75.0000 mg | ORAL_TABLET | Freq: Every day | ORAL | 0 refills | Status: DC

## 2023-02-17 NOTE — Telephone Encounter (Signed)
Atenolol and Clopidgrel sent to the pharmacy requested by the patient.

## 2023-02-17 NOTE — Telephone Encounter (Signed)
*  STAT* If patient is at the pharmacy, call can be transferred to refill team.   1. Which medications need to be refilled? (please list name of each medication and dose if known) atenolol (TENORMIN) 50 MG tablet   clopidogrel (PLAVIX) 75 MG tablet   2. Which pharmacy/location (including street and city if local pharmacy) is medication to be sent to? [id number] - [place], [place] - [address]   3. Do they need a 30 day or 90 day supply? 90\   Patient has appt for 8/26

## 2023-04-30 NOTE — Progress Notes (Unsigned)
Cardiology Office Note:   Date:  05/01/2023  ID:  Andrew Clark, DOB [birth year], MRN [id number] PCP: Andrew Bussing, MD  [facility] Providers Cardiologist:  Andrew Muss, MD    History of Present Illness:   Andrew Clark is a 62 y.o. male with history of CAD, hypertension, hyperlipidemia.   Patient with chest pressure, left arm pain, nausea in 2007 leading to [facility] with PCI to LAD and placement of stent. This LHC also noted mild disease in LCX and RCA. Patient maintained on DAPT with ASA/Plavix until 2017 when ASA stopped. Plavix monotherapy continued.   Since patient's last visit with Dr. Eldridge Dace in March of 2023, patient has continued with efforts at regular exercise, healthy diet, and weight loss. He expresses ongoing frustration with minimal weight change despite his efforts. For exercise he rides his bike, ~7 miles 4-5 days per week. He still splits his time between the beach and [place]. He continues with all medications as prescribed, denies side effects.   Today patient denies chest pain, shortness of breath, lower extremity edema, fatigue, palpitations, melena, hematuria, hemoptysis, diaphoresis, weakness, presyncope, syncope, orthopnea, and PND. He reports regular restful sleep and denies snoring/apnea at night.   Studies Reviewed:    EKG:   EKG Interpretation Date/Time:  Monday May 01 2023 08:11:08 EDT Ventricular Rate:  71 PR Interval:  186 QRS Duration:  84 QT Interval:  400 QTC Calculation: 434 R Axis:   18  Text Interpretation: Normal sinus rhythm Normal ECG When compared with ECG of 01-Jun-2006 15:55, No significant change was found Confirmed by Andrew Clark [phone]) on 05/01/2023 8:32:23 AM      Risk Assessment/Calculations:           Physical Exam:   VS:  BP 126/78   Pulse 71   Wt 235 lb 6.4 oz (106.8 kg)   SpO2 95%   BMI 31.06 kg/m    Wt Readings from Last 3 Encounters:  05/01/23 235 lb 6.4 oz (106.8 kg)  11/29/21 235 lb (106.6 kg)   06/17/20 238 lb 6.4 oz (108.1 kg)     Physical Exam Vitals reviewed.  Constitutional:      Appearance: Normal appearance.  HENT:     Head: Normocephalic.  Eyes:     Pupils: Pupils are equal, round, and reactive to light.  Cardiovascular:     Rate and Rhythm: Normal rate and regular rhythm.     Pulses: Normal pulses.     Heart sounds: Normal heart sounds.  Pulmonary:     Effort: Pulmonary effort is normal.     Breath sounds: Normal breath sounds.  Abdominal:     General: Abdomen is flat.     Palpations: Abdomen is soft.  Musculoskeletal:     Right lower leg: No edema.     Left lower leg: No edema.  Skin:    General: Skin is warm and dry.     Capillary Refill: Capillary refill takes less than 2 seconds.  Neurological:     General: No focal deficit present.     Mental Status: He is alert and oriented to person, place, and time.  Psychiatric:        Mood and Affect: Mood normal.        Behavior: Behavior normal.        Thought Content: Thought content normal.        Judgment: Judgment normal.      ASSESSMENT AND PLAN:   Essential hypertension Patient's blood pressure today well controlled  at 126/78. Per patient, this is similar to the reading in office at his physical in July. Previous BP readings as well as those at home have tended to be a little higher, 130s/90s. Discussed increasing atenolol vs monitoring with patient today. Given recent down-trend, will have patient keep a BP log for the next month. I encouraged him to purchase a new BP cuff as well, as his is 62 years old. Recent outpatient labs reviewed, BMP and renal function excellent.  Continue Atenolol 50mg       Coronary atherosclerosis of native coronary artery Patient continues to do well s/p PCI to LAD in 2007. He gets regular exercise and has no angina or exertional dyspnea. Continue with Plavix 75mg  daily, Crestor 40mg , Vascepa 2g BID.  Hyperlipidemia LDL at PCP in July of this year 73. Continue with  Crestor 40mg , Vascepa 2g BID.           Follow up 1 year.  Signed, Andrew Gold, PA-C

## 2023-05-01 ENCOUNTER — Encounter: Payer: Self-pay | Admitting: Cardiology

## 2023-05-01 ENCOUNTER — Ambulatory Visit: Payer: 59 | Attending: Cardiology | Admitting: Cardiology

## 2023-05-01 VITALS — BP 126/78 | HR 71 | Wt 235.4 lb

## 2023-05-01 DIAGNOSIS — I2510 Atherosclerotic heart disease of native coronary artery without angina pectoris: Secondary | ICD-10-CM | POA: Diagnosis not present

## 2023-05-01 DIAGNOSIS — I10 Essential (primary) hypertension: Secondary | ICD-10-CM | POA: Diagnosis not present

## 2023-05-01 DIAGNOSIS — E782 Mixed hyperlipidemia: Secondary | ICD-10-CM

## 2023-05-01 MED ORDER — ICOSAPENT ETHYL 1 G PO CAPS: 2.0000 g | ORAL_CAPSULE | Freq: Two times a day (BID) | ORAL | 3 refills | Status: DC

## 2023-05-01 NOTE — Assessment & Plan Note (Signed)
LDL at PCP in July of this year 69. Continue with Crestor 40mg , Vascepa 2g BID.

## 2023-05-01 NOTE — Patient Instructions (Signed)
Medication Instructions:   *If you need a refill on your cardiac medications before your next appointment, please call your pharmacy*   Lab Work:  If you have labs (blood work) drawn today and your tests are completely normal, you will receive your results only by: MyChart Message (if you have MyChart) OR A paper copy in the mail If you have any lab test that is abnormal or we need to change your treatment, we will call you to review the results.   Testing/Procedures:    Follow-Up: At [facility], you and your health needs are our priority.  As part of our continuing mission to provide you with exceptional heart care, we have created designated Provider Care Teams.  These Care Teams include your primary Cardiologist (physician) and Advanced Practice Providers (APPs -  Physician Assistants and Nurse Practitioners) who all work together to provide you with the care you need, when you need it.  We recommend signing up for the patient portal called "MyChart".  Sign up information is provided on this After Visit Summary.  MyChart is used to connect with patients for Virtual Visits (Telemedicine).  Patients are able to view lab/test results, encounter notes, upcoming appointments, etc.  Non-urgent messages can be sent to your provider as well.   To learn more about what you can do with MyChart, go to [email or web address].    Your next appointment:   1 year(s)  Provider:   Perlie Gold PA  Other Instructions

## 2023-05-01 NOTE — Assessment & Plan Note (Signed)
Patient continues to do well s/p PCI to LAD in 2007. He gets regular exercise and has no angina or exertional dyspnea. Continue with Plavix 75mg  daily, Crestor 40mg , Vascepa 2g BID.

## 2023-05-01 NOTE — Assessment & Plan Note (Signed)
Patient's blood pressure today well controlled at 126/78. Per patient, this is similar to the reading in office at his physical in July. Previous BP readings as well as those at home have tended to be a little higher, 130s/90s. Discussed increasing atenolol vs monitoring with patient today. Given recent down-trend, will have patient keep a BP log for the next month. I encouraged him to purchase a new BP cuff as well, as his is 62 years old. Recent outpatient labs reviewed, BMP and renal function excellent.  Continue Atenolol 50mg 

## 2023-05-02 ENCOUNTER — Telehealth: Payer: Self-pay | Admitting: *Deleted

## 2023-05-02 NOTE — Telephone Encounter (Signed)
   Pre-operative Risk Assessment    Patient Name: Andrew Clark  DOB: 08-24-1961 MRN: [id number]      Request for Surgical Clearance    Procedure:   Colonoscopy  Date of Surgery:  Clearance 06/09/23                                 Surgeon:  Dr. Charlott Rakes Surgeon's Group or Practice Name:  [facility] Phone number:  [phone] Fax number:  [phone]   Type of Clearance Requested:   - Medical  - Pharmacy:  Hold Clopidogrel (Plavix) Not Indicated   Type of Anesthesia:   Propofol   Additional requests/questions:  Patient was seen by Perlie Gold, PA on 05/01/2023.  Signed, Emmit Pomfret   05/02/2023, 2:42 PM

## 2023-05-03 NOTE — Telephone Encounter (Signed)
   Name: Andrew Clark  DOB: 26-May-1961  MRN: [id number]   Primary Cardiologist: Lance Muss, MD  Chart reviewed as part of pre-operative protocol coverage. Andrew Clark was last seen on 05/01/2023 by Perlie Gold, PA-C.  At that time, patient denied any concerning cardiac symptoms. He reported a high level of activity biking ~7 miles 4-5 days a week.   Therefore, based on ACC/AHA guidelines, the patient would be at acceptable risk for the planned procedure without further cardiovascular testing.   Per office protocol, he may hold Plavix for 5 days prior to procedure and should resume as soon as hemodynamically stable postoperatively. Patient should take aspirin 81 mg daily throughout peri-procedural period.   I will route this recommendation to the requesting party via Epic fax function and remove from pre-op pool. Please call with questions.  Carlos Levering, NP 05/03/2023, 10:51 AM

## 2023-05-16 ENCOUNTER — Other Ambulatory Visit: Payer: Self-pay | Admitting: Interventional Cardiology

## 2023-06-27 ENCOUNTER — Other Ambulatory Visit: Payer: Self-pay | Admitting: Gastroenterology

## 2023-06-27 DIAGNOSIS — Z1211 Encounter for screening for malignant neoplasm of colon: Secondary | ICD-10-CM

## 2023-06-27 DIAGNOSIS — Z538 Procedure and treatment not carried out for other reasons: Secondary | ICD-10-CM

## 2023-07-27 ENCOUNTER — Ambulatory Visit
Admission: RE | Admit: 2023-07-27 | Discharge: 2023-07-27 | Disposition: A | Discharge status: Home / Self Care | Payer: 59 | Source: Ambulatory Visit | Attending: Gastroenterology | Admitting: Gastroenterology

## 2023-07-27 DIAGNOSIS — Z1211 Encounter for screening for malignant neoplasm of colon: Secondary | ICD-10-CM

## 2023-07-27 DIAGNOSIS — Z538 Procedure and treatment not carried out for other reasons: Secondary | ICD-10-CM

## 2023-10-03 ENCOUNTER — Other Ambulatory Visit: Payer: Self-pay | Admitting: Interventional Cardiology

## 2024-04-05 LAB — LAB REPORT - SCANNED: EGFR: 91

## 2024-05-17 ENCOUNTER — Other Ambulatory Visit: Payer: Self-pay | Admitting: Interventional Cardiology

## 2024-05-22 ENCOUNTER — Ambulatory Visit: Admitting: Internal Medicine

## 2024-05-28 ENCOUNTER — Encounter: Payer: Self-pay | Admitting: Internal Medicine

## 2024-05-28 ENCOUNTER — Ambulatory Visit: Attending: Internal Medicine | Admitting: Internal Medicine

## 2024-05-28 VITALS — BP 126/84 | HR 73 | Ht 73.0 in | Wt 247.6 lb

## 2024-05-28 DIAGNOSIS — I10 Essential (primary) hypertension: Secondary | ICD-10-CM | POA: Diagnosis not present

## 2024-05-28 DIAGNOSIS — E782 Mixed hyperlipidemia: Secondary | ICD-10-CM | POA: Diagnosis not present

## 2024-05-28 DIAGNOSIS — I2510 Atherosclerotic heart disease of native coronary artery without angina pectoris: Secondary | ICD-10-CM

## 2024-05-28 MED ORDER — BLOOD PRESSURE CUFF MISC: 0 refills | Status: AC

## 2024-05-28 MED ORDER — CLOPIDOGREL BISULFATE 75 MG PO TABS: 75.0000 mg | ORAL_TABLET | Freq: Every day | ORAL | 3 refills | Status: AC

## 2024-05-28 MED ORDER — LISINOPRIL 10 MG PO TABS: 10.0000 mg | ORAL_TABLET | Freq: Every day | ORAL | 3 refills | Status: DC

## 2024-05-28 MED ORDER — ATENOLOL 50 MG PO TABS: ORAL_TABLET | ORAL | Status: AC

## 2024-05-28 NOTE — Patient Instructions (Signed)
 Medication Instructions:  TAPER DOWN ON Atenolol  to half tablet by mouth for 2 days and the stop  START Lisinopril  10 mg daily   Blood pressure cuff (CHECK BLOOD PRESSURE TWO TIMES DAILY FOR 2 WEEKS AND THEN ADVISE READINGS)  Blood Pressure Record Sheet To take your blood pressure, you will need a blood pressure machine. You can buy a blood pressure machine (blood pressure monitor) at your clinic, drug store, or online. When choosing one, consider: An automatic monitor that has an arm cuff. A cuff that wraps snugly around your upper arm. You should be able to fit only one finger between your arm and the cuff. A device that stores blood pressure reading results. Do not choose a monitor that measures your blood pressure from your wrist or finger. Follow your health care provider's instructions for how to take your blood pressure. To use this form: Take your blood pressure medications every day These measurements should be taken when you have been at rest for at least 10-15 min Take at least 2 readings with each blood pressure check. This makes sure the results are correct. Wait 1-2 minutes between measurements. Write down the results in the spaces on this form. Keep in mind it should always be recorded systolic over diastolic. Both numbers are important.  Repeat this every day for 2-3 weeks, or as told by your health care provider.  Make a follow-up appointment with your health care provider to discuss the results.  Blood Pressure Log Date Medications taken? (Y/N) Blood Pressure Time of Day                                                                                                         *If you need a refill on your cardiac medications before your next appointment, please call your pharmacy*  Lab Work: BMP IN 2 WEEKS   If you have labs (blood work) drawn today and your tests are completely normal, you will receive your results only by: MyChart Message (if you  have MyChart) OR A paper copy in the mail If you have any lab test that is abnormal or we need to change your treatment, we will call you to review the results.  Follow-Up: At [facility], you and your health needs are our priority.  As part of our continuing mission to provide you with exceptional heart care, our providers are all part of one team.  This team includes your primary Cardiologist (physician) and Advanced Practice Providers or APPs (Physician Assistants and Nurse Practitioners) who all work together to provide you with the care you need, when you need it.  Your next appointment:   1 year(s)  Provider:   Emeline FORBES Calender, MD
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
## 2024-06-10 ENCOUNTER — Other Ambulatory Visit: Payer: Self-pay

## 2024-06-12 MED ORDER — ROSUVASTATIN CALCIUM 40 MG PO TABS: 40.0000 mg | ORAL_TABLET | Freq: Every day | ORAL | 3 refills | Status: AC

## 2024-07-02 LAB — BASIC METABOLIC PANEL WITH GFR
BUN/Creatinine Ratio: 15 (ref 10–24)
BUN: 15 mg/dL (ref 8–27)
CO2: 22 mmol/L (ref 20–29)
Calcium: 9.7 mg/dL (ref 8.6–10.2)
Chloride: 98 mmol/L (ref 96–106)
Creatinine, Ser: 0.98 mg/dL (ref 0.76–1.27)
Glucose: 122 mg/dL — ABNORMAL HIGH (ref 70–99)
Potassium: 4.6 mmol/L (ref 3.5–5.2)
Sodium: 137 mmol/L (ref 134–144)
eGFR: 88 mL/min/1.73 (ref >59)

## 2024-07-04 ENCOUNTER — Ambulatory Visit: Payer: Self-pay | Admitting: Internal Medicine

## 2024-07-19 ENCOUNTER — Other Ambulatory Visit: Payer: Self-pay | Admitting: Cardiology

## 2024-07-22 MED ORDER — ICOSAPENT ETHYL 1 G PO CAPS: 2.0000 g | ORAL_CAPSULE | Freq: Two times a day (BID) | ORAL | 3 refills | Status: AC

## 2024-10-01 ENCOUNTER — Telehealth: Payer: Self-pay | Admitting: Internal Medicine

## 2024-10-01 NOTE — Telephone Encounter (Signed)
 Pt wasa told to f/u .   Pt c/o medication issue:  1. Name of Medication: lisinopril  (ZESTRIL ) 10 MG tablet   2. How are you currently taking this medication (dosage and times per day)? As written  3. Are you having a reaction (difficulty breathing--STAT)? no  4. What is your medication issue?    Pt calling to let Dr know his cough will not go away. Please advise.

## 2024-10-01 NOTE — Telephone Encounter (Signed)
 Spoke with patient of Dr. Kriste. Prescribed lisinopril  05/2024. Dry cough started not long after this med change (had previously been on atenolol  for 20+ years). He said he was advised of possible side effect of cough at this clinic visit. Current home BP readings 129-136/80s  Advised will send to Dr. Kriste for review.   Pharmacy: [facility]

## 2024-10-02 NOTE — Telephone Encounter (Signed)
 Spoke with patient and shared response from Pharm D:  Clark, Andrew, [facility]    10/02/24  7:39 AM Note Would recommend d/c lisinopril  to see if cough goes away.  If so, start losartan 25mg  once daily which is less likely to cause side effect     Patient will stop lisinopril  tomorrow as he already took today. He will wait about a week and then call us  back to update on if cough is improved. If cough has improved, can start losartan 25 mg daily per Pharm D.  Instructed patient to continue to monitor BP and notify us  sooner if BP is consistently >140/90. Patient verbalized understanding and expressed appreciation for follow-up.  Lisinopril  10 mg daily discontinued and removed from medication list.

## 2024-10-02 NOTE — Telephone Encounter (Signed)
 Would recommend d/c lisinopril  to see if cough goes away.  If so, start losartan 25mg  once daily which is less likely to cause side effect
